# Patient Record
Sex: Male | Born: 1960
Health system: Southern US, Community
[De-identification: ages and names within clinical notes are randomized; demographics above are authoritative.]

## PROBLEM LIST (undated history)

## (undated) DIAGNOSIS — I10 Essential (primary) hypertension: Secondary | ICD-10-CM

## (undated) DIAGNOSIS — B019 Varicella without complication: Secondary | ICD-10-CM

## (undated) DIAGNOSIS — S4440XA Injury of musculocutaneous nerve, unspecified arm, initial encounter: Secondary | ICD-10-CM

## (undated) DIAGNOSIS — M1990 Unspecified osteoarthritis, unspecified site: Secondary | ICD-10-CM

## (undated) DIAGNOSIS — E785 Hyperlipidemia, unspecified: Secondary | ICD-10-CM

## (undated) DIAGNOSIS — M5020 Other cervical disc displacement, unspecified cervical region: Secondary | ICD-10-CM

## (undated) DIAGNOSIS — G959 Disease of spinal cord, unspecified: Secondary | ICD-10-CM

## (undated) DIAGNOSIS — M5030 Other cervical disc degeneration, unspecified cervical region: Secondary | ICD-10-CM

## (undated) DIAGNOSIS — G950 Syringomyelia and syringobulbia: Secondary | ICD-10-CM

## (undated) HISTORY — DX: Unspecified osteoarthritis, unspecified site: M19.90

## (undated) HISTORY — DX: Disease of spinal cord, unspecified: G95.9

## (undated) HISTORY — DX: Injury of musculocutaneous nerve, unspecified arm, initial encounter: S44.40XA

## (undated) HISTORY — PX: OTHER SURGICAL HISTORY: SHX169

## (undated) HISTORY — DX: Syringomyelia and syringobulbia: G95.0

## (undated) HISTORY — PX: EYE SURGERY: SHX253

## (undated) HISTORY — DX: Varicella without complication: B01.9

## (undated) HISTORY — PX: KNEE SURGERY: SHX244

## (undated) HISTORY — PX: ARTHROSCOPIC REPAIR ACL: SUR80

## (undated) HISTORY — PX: POLYPECTOMY: SHX149

## (undated) HISTORY — PX: SHOULDER ARTHROSCOPY W/ LABRAL REPAIR: SHX2399

---

## 2004-11-19 ENCOUNTER — Ambulatory Visit: Payer: Self-pay | Admitting: Family Medicine

## 2005-01-06 ENCOUNTER — Ambulatory Visit: Payer: Self-pay | Admitting: Family Medicine

## 2005-01-13 ENCOUNTER — Ambulatory Visit: Payer: Self-pay | Admitting: Family Medicine

## 2005-09-01 ENCOUNTER — Ambulatory Visit: Payer: Self-pay | Admitting: Family Medicine

## 2006-07-24 ENCOUNTER — Ambulatory Visit: Payer: Self-pay | Admitting: Family Medicine

## 2006-07-31 ENCOUNTER — Ambulatory Visit: Payer: Self-pay | Admitting: Family Medicine

## 2006-08-08 ENCOUNTER — Ambulatory Visit: Payer: Self-pay

## 2006-08-30 ENCOUNTER — Ambulatory Visit: Payer: Self-pay | Admitting: Family Medicine

## 2006-11-13 ENCOUNTER — Ambulatory Visit (HOSPITAL_BASED_OUTPATIENT_CLINIC_OR_DEPARTMENT_OTHER): Admission: RE | Admit: 2006-11-13 | Discharge: 2006-11-13 | Payer: Self-pay | Admitting: Orthopaedic Surgery

## 2009-01-22 ENCOUNTER — Ambulatory Visit: Payer: Self-pay | Admitting: Family Medicine

## 2009-01-22 DIAGNOSIS — Z8679 Personal history of other diseases of the circulatory system: Secondary | ICD-10-CM | POA: Insufficient documentation

## 2009-01-22 DIAGNOSIS — B370 Candidal stomatitis: Secondary | ICD-10-CM | POA: Insufficient documentation

## 2009-04-06 ENCOUNTER — Ambulatory Visit: Payer: Self-pay | Admitting: Family Medicine

## 2009-04-06 LAB — CONVERTED CEMR LAB
Bilirubin Urine: NEGATIVE
Blood in Urine, dipstick: NEGATIVE
Glucose, Urine, Semiquant: NEGATIVE
Ketones, urine, test strip: NEGATIVE
Nitrite: NEGATIVE
Specific Gravity, Urine: 1.015
Urobilinogen, UA: 0.2
WBC Urine, dipstick: NEGATIVE
pH: 7

## 2009-04-10 LAB — CONVERTED CEMR LAB
ALT: 23 units/L (ref 0–53)
AST: 25 units/L (ref 0–37)
Albumin: 4.4 g/dL (ref 3.5–5.2)
Alkaline Phosphatase: 51 units/L (ref 39–117)
BUN: 15 mg/dL (ref 6–23)
Basophils Absolute: 0 10*3/uL (ref 0.0–0.1)
Basophils Relative: 0.2 % (ref 0.0–3.0)
Bilirubin, Direct: 0.1 mg/dL (ref 0.0–0.3)
CO2: 28 meq/L (ref 19–32)
Calcium: 9.2 mg/dL (ref 8.4–10.5)
Chloride: 105 meq/L (ref 96–112)
Cholesterol: 192 mg/dL (ref 0–200)
Creatinine, Ser: 0.8 mg/dL (ref 0.4–1.5)
Eosinophils Absolute: 0.2 10*3/uL (ref 0.0–0.7)
Eosinophils Relative: 4.1 % (ref 0.0–5.0)
GFR calc non Af Amer: 110.88 mL/min (ref >60)
Glucose, Bld: 94 mg/dL (ref 70–99)
HCT: 42.6 % (ref 39.0–52.0)
HDL: 35.8 mg/dL — ABNORMAL LOW (ref >39.00)
Hemoglobin: 14.9 g/dL (ref 13.0–17.0)
LDL Cholesterol: 126 mg/dL — ABNORMAL HIGH (ref 0–99)
Lymphocytes Relative: 36.9 % (ref 12.0–46.0)
Lymphs Abs: 1.4 10*3/uL (ref 0.7–4.0)
MCHC: 34.9 g/dL (ref 30.0–36.0)
MCV: 97.1 fL (ref 78.0–100.0)
Monocytes Absolute: 0.4 10*3/uL (ref 0.1–1.0)
Monocytes Relative: 10.6 % (ref 3.0–12.0)
Neutro Abs: 1.9 10*3/uL (ref 1.4–7.7)
Neutrophils Relative %: 48.2 % (ref 43.0–77.0)
Platelets: 166 10*3/uL (ref 150.0–400.0)
Potassium: 5 meq/L (ref 3.5–5.1)
RBC: 4.38 M/uL (ref 4.22–5.81)
RDW: 11.8 % (ref 11.5–14.6)
Sodium: 142 meq/L (ref 135–145)
TSH: 0.63 microintl units/mL (ref 0.35–5.50)
Total Bilirubin: 1 mg/dL (ref 0.3–1.2)
Total CHOL/HDL Ratio: 5
Total Protein: 6.8 g/dL (ref 6.0–8.3)
Triglycerides: 153 mg/dL — ABNORMAL HIGH (ref 0.0–149.0)
VLDL: 30.6 mg/dL (ref 0.0–40.0)
WBC: 3.9 10*3/uL — ABNORMAL LOW (ref 4.5–10.5)

## 2009-04-21 ENCOUNTER — Ambulatory Visit: Payer: Self-pay | Admitting: Family Medicine

## 2010-06-29 ENCOUNTER — Ambulatory Visit: Payer: Self-pay | Admitting: Family Medicine

## 2010-06-29 DIAGNOSIS — IMO0002 Reserved for concepts with insufficient information to code with codable children: Secondary | ICD-10-CM | POA: Insufficient documentation

## 2010-08-10 ENCOUNTER — Ambulatory Visit: Payer: Self-pay | Admitting: Family Medicine

## 2010-08-10 DIAGNOSIS — K644 Residual hemorrhoidal skin tags: Secondary | ICD-10-CM | POA: Insufficient documentation

## 2010-12-14 NOTE — Assessment & Plan Note (Signed)
Summary: cpx/jls [facility] WITH PT/MHF   Vital Signs:  Patient profile:   50 year old male Height:      70.5 inches Weight:      193 pounds BMI:     27.40 Temp:     98.2 degrees F oral Pulse rate:   56 / minute BP sitting:   114 / 86  (left arm) Cuff size:   regular  Vitals Entered By: Alfred Levins, CMA (April 21, 2009 1:38 PM) CC: cpx   History of Present Illness: 50 yr old male for cpx. Feels fine, and has no concerns.  Allergies (verified): No Known Drug Allergies  Past History:  Past Medical History: Reviewed history from 01/22/2009 and no changes required. Chickenpox normal cardiac stress test 08-08-06  Past Surgical History: Reviewed history from 01/22/2009 and no changes required. Lt eye repair Bunions and bone spurs removed from rt foot Anterior cruciate ligament reconstruction rt knee  Family History: Reviewed history from 01/22/2009 and no changes required. Family History of Alcoholism/Addiction  Social History: Reviewed history from 01/22/2009 and no changes required. Married Never Smoked Alcohol use-yes Drug use-no  Review of Systems  The patient denies anorexia, fever, weight loss, weight gain, vision loss, decreased hearing, hoarseness, chest pain, syncope, dyspnea on exertion, peripheral edema, prolonged cough, headaches, hemoptysis, abdominal pain, melena, hematochezia, severe indigestion/heartburn, hematuria, incontinence, genital sores, muscle weakness, suspicious skin lesions, transient blindness, difficulty walking, depression, unusual weight change, abnormal bleeding, enlarged lymph nodes, angioedema, breast masses, and testicular masses.    Physical Exam  General:  Well-developed,well-nourished,in no acute distress; alert,appropriate and cooperative throughout examination Head:  Normocephalic and atraumatic without obvious abnormalities. No apparent alopecia or balding.  Eyes:  No corneal or conjunctival inflammation noted. EOMI. Perrla. Funduscopic exam benign, without hemorrhages, exudates or papilledema. Vision grossly normal. Ears:  External ear exam shows no significant lesions or deformities.  Otoscopic examination reveals clear canals, tympanic membranes are intact bilaterally without bulging, retraction, inflammation or discharge. Hearing is grossly normal bilaterally. Nose:  External nasal examination shows no deformity or inflammation. Nasal mucosa are pink and moist without lesions or exudates. Mouth:  Oral mucosa and oropharynx without lesions or exudates.  Teeth in good repair. Neck:  No deformities, masses, or tenderness noted. Chest Wall:  No deformities, masses, tenderness or gynecomastia noted. Lungs:  Normal respiratory effort, chest expands symmetrically. Lungs are clear to auscultation, no crackles or wheezes. Heart:  Normal rate and regular rhythm. S1 and S2 normal without gallop, murmur, click, rub or other extra sounds. Abdomen:  Bowel sounds positive,abdomen soft and non-tender without masses, organomegaly or hernias noted. Genitalia:  Testes bilaterally descended without nodularity, tenderness or masses. No scrotal masses or lesions. No penis lesions or urethral discharge. Msk:  No deformity or scoliosis noted of thoracic or lumbar spine.   Pulses:  R and L carotid,radial,femoral,dorsalis pedis and posterior tibial pulses are full and equal bilaterally Extremities:  No clubbing, cyanosis, edema, or deformity noted with normal full range of motion of all joints.   Neurologic:  No cranial nerve deficits noted. Station and gait are normal. Plantar reflexes are down-going bilaterally. DTRs are symmetrical throughout. Sensory, motor and coordinative functions appear intact. Skin:  Intact without suspicious lesions or rashes Cervical Nodes:  No lymphadenopathy noted Axillary Nodes:  No palpable lymphadenopathy  Inguinal Nodes:  No significant adenopathy Psych:  Cognition and judgment appear intact. Alert and cooperative with normal attention span and concentration. No apparent delusions, illusions, hallucinations   Impression & Recommendations:  Problem # 1:  PHYSICAL EXAMINATION (ICD-V70.0)  Complete Medication List: 1)  Fish Oil Oil (Fish oil) .Marland Kitchen.. 1 by mouth once daily 2)  Multivitamins Tabs (Multiple vitamin) .Marland Kitchen.. 1 by mouth once daily  Patient Instructions: 1)  Please schedule a follow-up appointment as needed . Watch the diet.

## 2010-12-14 NOTE — Assessment & Plan Note (Signed)
Summary: pain in groin area ok per deb/njr   Vital Signs:  Patient profile:   50 year old male Weight:      192 pounds Temp:     98.0 degrees F oral BP sitting:   126 / 84  (left arm) Cuff size:   regular  Vitals Entered By: Raechel Ache, RN (June 29, 2010 2:23 PM) CC: C/o dull  groin pain near pelvic bone on left x 4 days.   History of Present Illness: Here for 4 days of a mild achy pain in the left groin. No lumps or swelling. No testicular pain. No urinary problems. No trauma, but he has been working out hard lately with lifting weights and running. He is training to run a marathon.   Allergies: No Known Drug Allergies  Past History:  Past Medical History: Reviewed history from 01/22/2009 and no changes required. Chickenpox normal cardiac stress test 08-08-06  Past Surgical History: Reviewed history from 01/22/2009 and no changes required. Lt eye repair Bunions and bone spurs removed from rt foot Anterior cruciate ligament reconstruction rt knee  Review of Systems  The patient denies anorexia, fever, weight loss, weight gain, vision loss, decreased hearing, hoarseness, chest pain, syncope, dyspnea on exertion, peripheral edema, prolonged cough, headaches, hemoptysis, abdominal pain, melena, hematochezia, severe indigestion/heartburn, hematuria, incontinence, genital sores, muscle weakness, suspicious skin lesions, transient blindness, difficulty walking, depression, unusual weight change, abnormal bleeding, enlarged lymph nodes, angioedema, breast masses, and testicular masses.    Physical Exam  General:  Well-developed,well-nourished,in no acute distress; alert,appropriate and cooperative throughout examination Abdomen:  no inguinal hernia.   Genitalia:  Testes bilaterally descended without nodularity, tenderness or masses. No scrotal masses or lesions. No penis lesions or urethral discharge. Msk:  tender over the insertion of the left hip adductors into the pelvis .  Full ROM of the hip   Impression & Recommendations:  Problem # 1:  GROIN STRAIN (ICD-848.8)  Complete Medication List: 1)  Fish Oil Oil (Fish oil) .Marland Kitchen.. 1 by mouth once daily 2)  Multivitamins Tabs (Multiple vitamin) .Marland Kitchen.. 1 by mouth once daily  Patient Instructions: 1)  rest, heat, Motrin as needed .  2)  Please schedule a follow-up appointment as needed .

## 2010-12-14 NOTE — Assessment & Plan Note (Signed)
Summary: HEMATOCHEZIA? // RS   Vital Signs:  Patient profile:   50 year old male Weight:      191 pounds O2 Sat:      96 % Temp:     09.2 degrees F oral Pulse rate:   57 / minute Resp:     12 per minute BP sitting:   132 / 82  Vitals Entered By: Lynann Beaver CMA (August 10, 2010 2:23 PM) CC: hematochezia x 6 episodes Is Patient Diabetic? No Pain Assessment Patient in pain? no        History of Present Illness: For the past 6 days he has had small amounts of bright red blood with stools. No pain, no difficulty passing stools. No abdominal pain or nausea or fever. He just returned from a business trip to [place].   Current Medications (verified): 1)  Fish Oil   Oil (Fish Oil) .Marland Kitchen.. 1 By Mouth Once Daily 2)  Multivitamins   Tabs (Multiple Vitamin) .Marland Kitchen.. 1 By Mouth Once Daily  Allergies (verified): No Known Drug Allergies  Past History:  Past Medical History: Reviewed history from 01/22/2009 and no changes required. Chickenpox normal cardiac stress test 08-08-06  Past Surgical History: Reviewed history from 01/22/2009 and no changes required. Lt eye repair Bunions and bone spurs removed from rt foot Anterior cruciate ligament reconstruction rt knee  Review of Systems  The patient denies anorexia, fever, weight loss, weight gain, vision loss, decreased hearing, hoarseness, chest pain, syncope, dyspnea on exertion, peripheral edema, prolonged cough, headaches, hemoptysis, abdominal pain, melena, severe indigestion/heartburn, hematuria, incontinence, genital sores, muscle weakness, suspicious skin lesions, transient blindness, difficulty walking, depression, unusual weight change, abnormal bleeding, enlarged lymph nodes, angioedema, breast masses, and testicular masses.    Physical Exam  General:  Well-developed,well-nourished,in no acute distress; alert,appropriate and cooperative throughout examination Abdomen:  Bowel sounds positive,abdomen soft and non-tender without  masses, organomegaly or hernias noted. Rectal:  several small external hemorrhoids visible, one has a partially healed laceration, no visible bleeding   Impression & Recommendations:  Problem # 1:  HEMORRHOIDS, EXTERNAL (ICD-455.3)  Complete Medication List: 1)  Fish Oil Oil (Fish oil) .Marland Kitchen.. 1 by mouth once daily 2)  Multivitamins Tabs (Multiple vitamin) .Marland Kitchen.. 1 by mouth once daily  Patient Instructions: 1)  use Preparation H as needed, increase fiber in the diet, etc.
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
## 2012-03-20 ENCOUNTER — Other Ambulatory Visit (INDEPENDENT_AMBULATORY_CARE_PROVIDER_SITE_OTHER): Payer: Self-pay

## 2012-03-20 DIAGNOSIS — Z0000 Encounter for general adult medical examination without abnormal findings: Secondary | ICD-10-CM

## 2012-03-20 LAB — BASIC METABOLIC PANEL
BUN: 12 mg/dL (ref 6–23)
CO2: 28 mEq/L (ref 19–32)
Calcium: 9.2 mg/dL (ref 8.4–10.5)
Chloride: 102 mEq/L (ref 96–112)
Creatinine, Ser: 0.9 mg/dL (ref 0.4–1.5)
GFR: 93.16 mL/min (ref >60.00)
Glucose, Bld: 91 mg/dL (ref 70–99)
Potassium: 4.9 mEq/L (ref 3.5–5.1)
Sodium: 141 mEq/L (ref 135–145)

## 2012-03-20 LAB — POCT URINALYSIS DIPSTICK
Bilirubin, UA: NEGATIVE
Blood, UA: NEGATIVE
Glucose, UA: NEGATIVE
Ketones, UA: NEGATIVE
Leukocytes, UA: NEGATIVE
Nitrite, UA: NEGATIVE
Spec Grav, UA: 1.02
Urobilinogen, UA: 0.2
pH, UA: 7.5

## 2012-03-20 LAB — CBC WITH DIFFERENTIAL/PLATELET
Basophils Absolute: 0 10*3/uL (ref 0.0–0.1)
Basophils Relative: 0.6 % (ref 0.0–3.0)
Eosinophils Absolute: 0.1 10*3/uL (ref 0.0–0.7)
Eosinophils Relative: 1.7 % (ref 0.0–5.0)
HCT: 47.8 % (ref 39.0–52.0)
Hemoglobin: 16.4 g/dL (ref 13.0–17.0)
Lymphocytes Relative: 20 % (ref 12.0–46.0)
Lymphs Abs: 0.9 10*3/uL (ref 0.7–4.0)
MCHC: 34.3 g/dL (ref 30.0–36.0)
MCV: 100.2 fl — ABNORMAL HIGH (ref 78.0–100.0)
Monocytes Absolute: 0.4 10*3/uL (ref 0.1–1.0)
Monocytes Relative: 9.4 % (ref 3.0–12.0)
Neutro Abs: 3.2 10*3/uL (ref 1.4–7.7)
Neutrophils Relative %: 68.3 % (ref 43.0–77.0)
Platelets: 185 10*3/uL (ref 150.0–400.0)
RBC: 4.77 Mil/uL (ref 4.22–5.81)
RDW: 13.4 % (ref 11.5–14.6)
WBC: 4.7 10*3/uL (ref 4.5–10.5)

## 2012-03-20 LAB — LDL CHOLESTEROL, DIRECT: Direct LDL: 147.3 mg/dL

## 2012-03-20 LAB — HEPATIC FUNCTION PANEL
ALT: 25 U/L (ref 0–53)
AST: 28 U/L (ref 0–37)
Albumin: 4.3 g/dL (ref 3.5–5.2)
Alkaline Phosphatase: 43 U/L (ref 39–117)
Bilirubin, Direct: 0.1 mg/dL (ref 0.0–0.3)
Total Bilirubin: 0.9 mg/dL (ref 0.3–1.2)
Total Protein: 7.3 g/dL (ref 6.0–8.3)

## 2012-03-20 LAB — LIPID PANEL
Cholesterol: 210 mg/dL — ABNORMAL HIGH (ref 0–200)
HDL: 47.3 mg/dL (ref >39.00)
Total CHOL/HDL Ratio: 4
Triglycerides: 154 mg/dL — ABNORMAL HIGH (ref 0.0–149.0)
VLDL: 30.8 mg/dL (ref 0.0–40.0)

## 2012-03-20 LAB — TSH: TSH: 0.44 u[IU]/mL (ref 0.35–5.50)

## 2012-03-23 ENCOUNTER — Encounter: Payer: Self-pay | Admitting: Family Medicine

## 2012-03-23 NOTE — Progress Notes (Signed)
Quick Note:  I spoke with pt and put a copy of results in mail. ______ 

## 2012-03-26 ENCOUNTER — Encounter: Payer: Self-pay | Admitting: Family Medicine

## 2012-03-27 ENCOUNTER — Ambulatory Visit (INDEPENDENT_AMBULATORY_CARE_PROVIDER_SITE_OTHER): Payer: Self-pay | Admitting: Family Medicine

## 2012-03-27 ENCOUNTER — Encounter: Payer: Self-pay | Admitting: Family Medicine

## 2012-03-27 VITALS — BP 160/100 | HR 71 | Temp 98.4°F | Ht 70.0 in | Wt 189.0 lb

## 2012-03-27 DIAGNOSIS — Z0000 Encounter for general adult medical examination without abnormal findings: Secondary | ICD-10-CM

## 2012-03-27 DIAGNOSIS — I10 Essential (primary) hypertension: Secondary | ICD-10-CM

## 2012-03-27 MED ORDER — LISINOPRIL 10 MG PO TABS: 10.0000 mg | ORAL_TABLET | Freq: Every day | ORAL | Status: DC

## 2012-03-27 NOTE — Progress Notes (Signed)
  Subjective:    Patient ID: Todd Reid, male    DOB: 1961/05/13, 51 y.o.   MRN: [id number]  HPI 51 yr old male for a cpx. He feels well with no complaints. He has been dealing with a grief reaction since his wife, Thurston Hole, died of metastatic breast cancer last November. He has been talking to his friends and the clergy at his church. He has done fairly well with this process, and he has even been on a few dates. His BP has been consistently high for the past few months. He tries to eat a healthy diet but had not been exercising until recently.    Review of Systems  Constitutional: Negative.   HENT: Negative.   Eyes: Negative.   Respiratory: Negative.   Cardiovascular: Negative.   Gastrointestinal: Negative.   Genitourinary: Negative.   Musculoskeletal: Negative.   Skin: Negative.   Neurological: Negative.   Hematological: Negative.   Psychiatric/Behavioral: Negative.        Objective:   Physical Exam  Constitutional: He is oriented to person, place, and time. He appears well-developed and well-nourished. No distress.  HENT:  Head: Normocephalic and atraumatic.  Right Ear: External ear normal.  Left Ear: External ear normal.  Nose: Nose normal.  Mouth/Throat: Oropharynx is clear and moist. No oropharyngeal exudate.  Eyes: Conjunctivae and EOM are normal. Pupils are equal, round, and reactive to light. Right eye exhibits no discharge. Left eye exhibits no discharge. No scleral icterus.  Neck: Neck supple. No JVD present. No tracheal deviation present. No thyromegaly present.  Cardiovascular: Normal rate, regular rhythm, normal heart sounds and intact distal pulses.  Exam reveals no gallop and no friction rub.   No murmur heard.      EKG normal   Pulmonary/Chest: Effort normal and breath sounds normal. No respiratory distress. He has no wheezes. He has no rales. He exhibits no tenderness.  Abdominal: Soft. Bowel sounds are normal. He exhibits no distension and no mass. There is  no tenderness. There is no rebound and no guarding.  Genitourinary: Rectum normal, prostate normal and penis normal. Guaiac negative stool. No penile tenderness.  Musculoskeletal: Normal range of motion. He exhibits no edema and no tenderness.  Lymphadenopathy:    He has no cervical adenopathy.  Neurological: He is alert and oriented to person, place, and time. He has normal reflexes. No cranial nerve deficit. He exhibits normal muscle tone. Coordination normal.  Skin: Skin is warm and dry. No rash noted. He is not diaphoretic. No erythema. No pallor.  Psychiatric: He has a normal mood and affect. His behavior is normal. Judgment and thought content normal.          Assessment & Plan:  Well exam. We will start him on Lisinopril daily and he will recheck in one month

## 2012-05-04 ENCOUNTER — Ambulatory Visit (INDEPENDENT_AMBULATORY_CARE_PROVIDER_SITE_OTHER): Payer: Self-pay | Admitting: Family Medicine

## 2012-05-04 ENCOUNTER — Encounter: Payer: Self-pay | Admitting: Family Medicine

## 2012-05-04 VITALS — BP 140/80 | HR 63 | Temp 98.3°F | Wt 186.0 lb

## 2012-05-04 DIAGNOSIS — Z0000 Encounter for general adult medical examination without abnormal findings: Secondary | ICD-10-CM

## 2012-05-04 DIAGNOSIS — I10 Essential (primary) hypertension: Secondary | ICD-10-CM

## 2012-05-04 MED ORDER — LISINOPRIL-HYDROCHLOROTHIAZIDE 10-12.5 MG PO TABS: 1.0000 | ORAL_TABLET | Freq: Every day | ORAL | Status: DC

## 2012-05-04 NOTE — Progress Notes (Signed)
  Subjective:    Patient ID: Todd Reid, male    DOB: [birth year], 51 y.o.   MRN: [id number]  HPI 51 yr old male for a cpx. He feels great but has one question. He has had some erection difficulties lately and wants to try something for this. He achieves erections easily but they don't last very long. He is working out with a Systems analyst.    Review of Systems  Constitutional: Negative.   HENT: Negative.   Eyes: Negative.   Respiratory: Negative.   Cardiovascular: Negative.   Gastrointestinal: Negative.   Genitourinary: Negative.   Musculoskeletal: Negative.   Skin: Negative.   Neurological: Negative.   Hematological: Negative.   Psychiatric/Behavioral: Negative.        Objective:   Physical Exam  Constitutional: He is oriented to person, place, and time. He appears well-developed and well-nourished. No distress.  HENT:  Head: Normocephalic and atraumatic.  Right Ear: External ear normal.  Left Ear: External ear normal.  Nose: Nose normal.  Mouth/Throat: Oropharynx is clear and moist. No oropharyngeal exudate.  Eyes: Conjunctivae and EOM are normal. Pupils are equal, round, and reactive to light. Right eye exhibits no discharge. Left eye exhibits no discharge. No scleral icterus.  Neck: Neck supple. No JVD present. No tracheal deviation present. No thyromegaly present.  Cardiovascular: Normal rate, regular rhythm and intact distal pulses.  Exam reveals no gallop and no friction rub.        2/6 SM over the left sternal border   Pulmonary/Chest: Effort normal and breath sounds normal. No respiratory distress. He has no wheezes. He has no rales. He exhibits no tenderness.  Abdominal: Soft. Bowel sounds are normal. He exhibits no distension and no mass. There is no tenderness. There is no rebound and no guarding.  Genitourinary: Rectum normal, prostate normal and penis normal. Guaiac negative stool. No penile tenderness.  Musculoskeletal: Normal range of motion. He exhibits  no edema and no tenderness.  Lymphadenopathy:    He has no cervical adenopathy.  Neurological: He is alert and oriented to person, place, and time. He has normal reflexes. No cranial nerve deficit. He exhibits normal muscle tone. Coordination normal.  Skin: Skin is warm and dry. No rash noted. He is not diaphoretic. No erythema. No pallor.  Psychiatric: He has a normal mood and affect. His behavior is normal. Judgment and thought content normal.          Assessment & Plan:  Well exam. We will set up an ECHO to evaluate the murmur. The last one he had was over 10 years ago. Given samples to try Cialis 20mg . We will change his BP med to Lisinopril HCT and recheck in one month

## 2012-05-25 ENCOUNTER — Telehealth: Payer: Self-pay | Admitting: Family Medicine

## 2012-05-25 NOTE — Telephone Encounter (Signed)
Call in Cialis 20 mg prn, #10 with 11 rf 

## 2012-05-25 NOTE — Telephone Encounter (Signed)
Pt states he was offered a rx for cialis previously but declined.  However now he would like to have rx sent in to [facility] on [address].

## 2012-05-28 MED ORDER — TADALAFIL 20 MG PO TABS: 20.0000 mg | ORAL_TABLET | Freq: Every day | ORAL | Status: DC | PRN

## 2012-05-28 NOTE — Telephone Encounter (Signed)
I sent script e-scribe and spoke with pt. 

## 2012-09-26 ENCOUNTER — Ambulatory Visit (INDEPENDENT_AMBULATORY_CARE_PROVIDER_SITE_OTHER): Payer: Self-pay | Admitting: Family Medicine

## 2012-09-26 ENCOUNTER — Encounter: Payer: Self-pay | Admitting: Family Medicine

## 2012-09-26 VITALS — BP 124/80 | HR 56 | Temp 98.2°F | Wt 189.0 lb

## 2012-09-26 DIAGNOSIS — H532 Diplopia: Secondary | ICD-10-CM

## 2012-09-26 DIAGNOSIS — R42 Dizziness and giddiness: Secondary | ICD-10-CM

## 2012-09-26 DIAGNOSIS — M542 Cervicalgia: Secondary | ICD-10-CM

## 2012-09-26 DIAGNOSIS — Z23 Encounter for immunization: Secondary | ICD-10-CM
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
## 2012-11-09 ENCOUNTER — Ambulatory Visit (INDEPENDENT_AMBULATORY_CARE_PROVIDER_SITE_OTHER): Payer: BC Managed Care – PPO | Admitting: Family Medicine

## 2012-11-09 ENCOUNTER — Encounter: Payer: Self-pay | Admitting: Family Medicine

## 2012-11-09 VITALS — BP 110/80 | HR 60 | Temp 98.1°F | Wt 186.0 lb

## 2012-11-09 DIAGNOSIS — J329 Chronic sinusitis, unspecified: Secondary | ICD-10-CM

## 2012-11-09 DIAGNOSIS — J029 Acute pharyngitis, unspecified: Secondary | ICD-10-CM

## 2012-11-09 LAB — POCT RAPID STREP A (OFFICE): Rapid Strep A Screen: NEGATIVE

## 2012-11-09 MED ORDER — AMOXICILLIN-POT CLAVULANATE 875-125 MG PO TABS: 1.0000 | ORAL_TABLET | Freq: Two times a day (BID) | ORAL | Status: DC

## 2012-11-09 NOTE — Progress Notes (Signed)
  Subjective:    Patient ID: Todd Reid, male    DOB: [birth year], 51 y.o.   MRN: [id number]  HPI Here for 3 days of sinus pressure, PND, ST, and a dry cough. No fever.    Review of Systems  Constitutional: Negative.   HENT: Positive for congestion, postnasal drip and sinus pressure.   Eyes: Negative.   Respiratory: Positive for cough.        Objective:   Physical Exam  Constitutional: He appears well-developed and well-nourished.  HENT:  Right Ear: External ear normal.  Left Ear: External ear normal.  Nose: Nose normal.  Mouth/Throat: Oropharynx is clear and moist.  Eyes: Conjunctivae normal are normal.  Pulmonary/Chest: Effort normal and breath sounds normal.  Lymphadenopathy:    He has no cervical adenopathy.          Assessment & Plan:  Add Mucinex

## 2012-12-21 ENCOUNTER — Ambulatory Visit
Payer: BC Managed Care – PPO | Attending: Diagnostic Neuroimaging | Admitting: Rehabilitative and Restorative Service Providers"

## 2012-12-21 DIAGNOSIS — M542 Cervicalgia: Secondary | ICD-10-CM | POA: Insufficient documentation

## 2012-12-21 DIAGNOSIS — R269 Unspecified abnormalities of gait and mobility: Secondary | ICD-10-CM | POA: Insufficient documentation

## 2012-12-21 DIAGNOSIS — R42 Dizziness and giddiness: Secondary | ICD-10-CM | POA: Insufficient documentation

## 2012-12-21 DIAGNOSIS — IMO0001 Reserved for inherently not codable concepts without codable children: Secondary | ICD-10-CM | POA: Insufficient documentation

## 2012-12-25 ENCOUNTER — Ambulatory Visit: Payer: BC Managed Care – PPO | Admitting: Physical Therapy

## 2012-12-28 ENCOUNTER — Ambulatory Visit: Payer: BC Managed Care – PPO | Admitting: Rehabilitative and Restorative Service Providers"

## 2013-01-08 ENCOUNTER — Ambulatory Visit: Payer: BC Managed Care – PPO | Admitting: Rehabilitative and Restorative Service Providers"

## 2013-01-10 ENCOUNTER — Ambulatory Visit: Payer: BC Managed Care – PPO | Admitting: Rehabilitative and Restorative Service Providers"

## 2013-01-14 ENCOUNTER — Ambulatory Visit
Payer: BC Managed Care – PPO | Attending: Diagnostic Neuroimaging | Admitting: Rehabilitative and Restorative Service Providers"

## 2013-01-14 DIAGNOSIS — M542 Cervicalgia: Secondary | ICD-10-CM | POA: Insufficient documentation

## 2013-01-14 DIAGNOSIS — R42 Dizziness and giddiness: Secondary | ICD-10-CM | POA: Insufficient documentation

## 2013-01-14 DIAGNOSIS — IMO0001 Reserved for inherently not codable concepts without codable children: Secondary | ICD-10-CM | POA: Insufficient documentation

## 2013-01-14 DIAGNOSIS — R269 Unspecified abnormalities of gait and mobility: Secondary | ICD-10-CM | POA: Insufficient documentation

## 2013-01-15 ENCOUNTER — Encounter: Payer: BC Managed Care – PPO | Admitting: Rehabilitative and Restorative Service Providers"

## 2013-01-17 ENCOUNTER — Ambulatory Visit: Payer: BC Managed Care – PPO | Admitting: Rehabilitative and Restorative Service Providers"

## 2013-01-23 ENCOUNTER — Ambulatory Visit: Payer: BC Managed Care – PPO | Admitting: Rehabilitative and Restorative Service Providers"

## 2013-01-28 ENCOUNTER — Ambulatory Visit: Payer: BC Managed Care – PPO | Admitting: Rehabilitative and Restorative Service Providers"

## 2013-01-31 ENCOUNTER — Ambulatory Visit: Payer: BC Managed Care – PPO | Admitting: Rehabilitative and Restorative Service Providers"

## 2013-02-07 ENCOUNTER — Ambulatory Visit: Payer: BC Managed Care – PPO | Admitting: Rehabilitative and Restorative Service Providers"

## 2013-02-13 ENCOUNTER — Ambulatory Visit
Payer: BC Managed Care – PPO | Attending: Diagnostic Neuroimaging | Admitting: Rehabilitative and Restorative Service Providers"

## 2013-02-13 DIAGNOSIS — M542 Cervicalgia: Secondary | ICD-10-CM | POA: Insufficient documentation

## 2013-02-13 DIAGNOSIS — R42 Dizziness and giddiness: Secondary | ICD-10-CM | POA: Insufficient documentation

## 2013-02-13 DIAGNOSIS — R269 Unspecified abnormalities of gait and mobility: Secondary | ICD-10-CM | POA: Insufficient documentation

## 2013-02-13 DIAGNOSIS — IMO0001 Reserved for inherently not codable concepts without codable children: Secondary | ICD-10-CM | POA: Insufficient documentation

## 2013-05-10 ENCOUNTER — Ambulatory Visit (INDEPENDENT_AMBULATORY_CARE_PROVIDER_SITE_OTHER): Payer: BC Managed Care – PPO | Admitting: Family Medicine

## 2013-05-10 ENCOUNTER — Encounter: Payer: Self-pay | Admitting: Family Medicine

## 2013-05-10 VITALS — BP 130/84 | HR 63 | Temp 98.1°F | Wt 195.0 lb

## 2013-05-10 DIAGNOSIS — J069 Acute upper respiratory infection, unspecified: Secondary | ICD-10-CM

## 2013-05-10 NOTE — Progress Notes (Signed)
  Subjective:    Patient ID: Todd Reid, male    DOB: 05-12-1961, 52 y.o.   MRN: [id number]  HPI Here with 2 days of a ST. No fever or HA or cough.    Review of Systems  Constitutional: Negative.   HENT: Positive for sore throat. Negative for ear pain, congestion, postnasal drip and sinus pressure.   Eyes: Negative.   Respiratory: Negative.        Objective:   Physical Exam  Constitutional: He appears well-developed and well-nourished.  HENT:  Right Ear: External ear normal.  Left Ear: External ear normal.  Nose: Nose normal.  His uvula is red and swollen   Eyes: Conjunctivae are normal.  Neck: No thyromegaly present.  Pulmonary/Chest: Effort normal and breath sounds normal.  Lymphadenopathy:    He has no cervical adenopathy.          Assessment & Plan:  Rest, drink fluids, use Motrin prn

## 2013-06-17 ENCOUNTER — Other Ambulatory Visit: Payer: Self-pay | Admitting: Family Medicine

## 2013-08-30 ENCOUNTER — Other Ambulatory Visit: Payer: Self-pay | Admitting: Family Medicine

## 2013-10-13 ENCOUNTER — Other Ambulatory Visit: Payer: Self-pay | Admitting: Family Medicine

## 2013-10-14 NOTE — Telephone Encounter (Signed)
Looks like pt needs office visit, can we refill this?

## 2013-10-14 NOTE — Telephone Encounter (Signed)
Refill for one year 

## 2014-10-15 ENCOUNTER — Other Ambulatory Visit: Payer: Self-pay | Admitting: Family Medicine

## 2014-12-26 ENCOUNTER — Other Ambulatory Visit: Payer: Self-pay | Admitting: Family Medicine

## 2015-01-20 ENCOUNTER — Other Ambulatory Visit (INDEPENDENT_AMBULATORY_CARE_PROVIDER_SITE_OTHER): Payer: BLUE CROSS/BLUE SHIELD

## 2015-01-20 DIAGNOSIS — Z0000 Encounter for general adult medical examination without abnormal findings: Secondary | ICD-10-CM

## 2015-01-20 LAB — POCT URINALYSIS DIPSTICK
Blood, UA: NEGATIVE
Glucose, UA: NEGATIVE
Ketones, UA: NEGATIVE
Leukocytes, UA: NEGATIVE
Nitrite, UA: NEGATIVE
Spec Grav, UA: 1.02
Urobilinogen, UA: 0.2
pH, UA: 5.5

## 2015-01-20 LAB — CBC WITH DIFFERENTIAL/PLATELET
Basophils Absolute: 0 10*3/uL (ref 0.0–0.1)
Basophils Relative: 0.7 % (ref 0.0–3.0)
Eosinophils Absolute: 0.1 10*3/uL (ref 0.0–0.7)
Eosinophils Relative: 3.8 % (ref 0.0–5.0)
HCT: 44.9 % (ref 39.0–52.0)
Hemoglobin: 15.9 g/dL (ref 13.0–17.0)
Lymphocytes Relative: 35.1 % (ref 12.0–46.0)
Lymphs Abs: 1.3 10*3/uL (ref 0.7–4.0)
MCHC: 35.4 g/dL (ref 30.0–36.0)
MCV: 93.6 fl (ref 78.0–100.0)
Monocytes Absolute: 0.4 10*3/uL (ref 0.1–1.0)
Monocytes Relative: 10.8 % (ref 3.0–12.0)
Neutro Abs: 1.8 10*3/uL (ref 1.4–7.7)
Neutrophils Relative %: 49.6 % (ref 43.0–77.0)
Platelets: 203 10*3/uL (ref 150.0–400.0)
RBC: 4.8 Mil/uL (ref 4.22–5.81)
RDW: 12.2 % (ref 11.5–15.5)
WBC: 3.7 10*3/uL — ABNORMAL LOW (ref 4.0–10.5)

## 2015-01-20 LAB — HEPATIC FUNCTION PANEL
ALT: 24 U/L (ref 0–53)
AST: 24 U/L (ref 0–37)
Albumin: 4.9 g/dL (ref 3.5–5.2)
Alkaline Phosphatase: 48 U/L (ref 39–117)
Bilirubin, Direct: 0.2 mg/dL (ref 0.0–0.3)
Total Bilirubin: 0.8 mg/dL (ref 0.2–1.2)
Total Protein: 7.2 g/dL (ref 6.0–8.3)

## 2015-01-20 LAB — BASIC METABOLIC PANEL
BUN: 16 mg/dL (ref 6–23)
CO2: 30 mEq/L (ref 19–32)
Calcium: 9.7 mg/dL (ref 8.4–10.5)
Chloride: 104 mEq/L (ref 96–112)
Creatinine, Ser: 0.98 mg/dL (ref 0.40–1.50)
GFR: 85.62 mL/min (ref >60.00)
Glucose, Bld: 103 mg/dL — ABNORMAL HIGH (ref 70–99)
Potassium: 4.2 mEq/L (ref 3.5–5.1)
Sodium: 139 mEq/L (ref 135–145)

## 2015-01-20 LAB — LIPID PANEL
Cholesterol: 252 mg/dL — ABNORMAL HIGH (ref 0–200)
HDL: 47.5 mg/dL (ref >39.00)
LDL Cholesterol: 183 mg/dL — ABNORMAL HIGH (ref 0–99)
NonHDL: 204.5
Total CHOL/HDL Ratio: 5
Triglycerides: 109 mg/dL (ref 0.0–149.0)
VLDL: 21.8 mg/dL (ref 0.0–40.0)

## 2015-01-20 LAB — PSA: PSA: 0.29 ng/mL (ref 0.10–4.00)

## 2015-01-20 LAB — TSH: TSH: 0.92 u[IU]/mL (ref 0.35–4.50)

## 2015-01-26 ENCOUNTER — Ambulatory Visit (INDEPENDENT_AMBULATORY_CARE_PROVIDER_SITE_OTHER): Payer: BLUE CROSS/BLUE SHIELD | Admitting: Family Medicine

## 2015-01-26 ENCOUNTER — Encounter: Payer: Self-pay | Admitting: Family Medicine

## 2015-01-26 VITALS — BP 134/80 | HR 61 | Temp 98.0°F | Ht 70.0 in | Wt 196.0 lb

## 2015-01-26 DIAGNOSIS — Z0000 Encounter for general adult medical examination without abnormal findings: Secondary | ICD-10-CM

## 2015-01-26 DIAGNOSIS — E785 Hyperlipidemia, unspecified: Secondary | ICD-10-CM

## 2015-01-26 MED ORDER — ATORVASTATIN CALCIUM 20 MG PO TABS: 20.0000 mg | ORAL_TABLET | Freq: Every day | ORAL | Status: DC

## 2015-01-26 MED ORDER — LISINOPRIL-HYDROCHLOROTHIAZIDE 10-12.5 MG PO TABS: 1.0000 | ORAL_TABLET | Freq: Every day | ORAL | Status: DC

## 2015-01-26 NOTE — Progress Notes (Signed)
Pre visit review using our clinic review tool, if applicable. No additional management support is needed unless otherwise documented below in the visit note. 

## 2015-01-26 NOTE — Progress Notes (Signed)
   Subjective:    Patient ID: Todd Reid, male    DOB: 08-03-1961, 54 y.o.   MRN: [id number]  HPI 54 yr old male for a cpx. He feels well.    Review of Systems  Constitutional: Negative.   HENT: Negative.   Eyes: Negative.   Respiratory: Negative.   Cardiovascular: Negative.   Gastrointestinal: Negative.   Genitourinary: Negative.   Musculoskeletal: Negative.   Skin: Negative.   Neurological: Negative.   Psychiatric/Behavioral: Negative.        Objective:   Physical Exam  Constitutional: He is oriented to person, place, and time. He appears well-developed and well-nourished. No distress.  HENT:  Head: Normocephalic and atraumatic.  Right Ear: External ear normal.  Left Ear: External ear normal.  Nose: Nose normal.  Mouth/Throat: Oropharynx is clear and moist. No oropharyngeal exudate.  Eyes: Conjunctivae and EOM are normal. Pupils are equal, round, and reactive to light. Right eye exhibits no discharge. Left eye exhibits no discharge. No scleral icterus.  Neck: Neck supple. No JVD present. No tracheal deviation present. No thyromegaly present.  Cardiovascular: Normal rate, regular rhythm, normal heart sounds and intact distal pulses.  Exam reveals no gallop and no friction rub.   No murmur heard. EKG normal   Pulmonary/Chest: Effort normal and breath sounds normal. No respiratory distress. He has no wheezes. He has no rales. He exhibits no tenderness.  Abdominal: Soft. Bowel sounds are normal. He exhibits no distension and no mass. There is no tenderness. There is no rebound and no guarding.  Genitourinary: Rectum normal, prostate normal and penis normal. Guaiac negative stool. No penile tenderness.  Musculoskeletal: Normal range of motion. He exhibits no edema or tenderness.  Lymphadenopathy:    He has no cervical adenopathy.  Neurological: He is alert and oriented to person, place, and time. He has normal reflexes. No cranial nerve deficit. He exhibits normal muscle  tone. Coordination normal.  Skin: Skin is warm and dry. No rash noted. He is not diaphoretic. No erythema. No pallor.  Psychiatric: He has a normal mood and affect. His behavior is normal. Judgment and thought content normal.          Assessment & Plan:  Well exam. Start on Lipitor 20 mg daily. Recheck labs in 90 days

## 2015-03-19 ENCOUNTER — Other Ambulatory Visit: Payer: Self-pay

## 2015-03-19 MED ORDER — LISINOPRIL-HYDROCHLOROTHIAZIDE 10-12.5 MG PO TABS: 1.0000 | ORAL_TABLET | Freq: Every day | ORAL | Status: DC

## 2015-03-19 MED ORDER — ATORVASTATIN CALCIUM 20 MG PO TABS: 20.0000 mg | ORAL_TABLET | Freq: Every day | ORAL | Status: DC

## 2015-04-30 ENCOUNTER — Encounter: Payer: Self-pay | Admitting: Family Medicine

## 2015-04-30 ENCOUNTER — Ambulatory Visit (INDEPENDENT_AMBULATORY_CARE_PROVIDER_SITE_OTHER): Payer: BLUE CROSS/BLUE SHIELD | Admitting: Family Medicine

## 2015-04-30 VITALS — BP 122/73 | HR 68 | Temp 98.2°F | Ht 70.0 in | Wt 194.0 lb

## 2015-04-30 DIAGNOSIS — E785 Hyperlipidemia, unspecified: Secondary | ICD-10-CM | POA: Diagnosis not present

## 2015-04-30 DIAGNOSIS — N486 Induration penis plastica: Secondary | ICD-10-CM | POA: Insufficient documentation

## 2015-04-30 DIAGNOSIS — M542 Cervicalgia: Secondary | ICD-10-CM

## 2015-04-30 DIAGNOSIS — I10 Essential (primary) hypertension: Secondary | ICD-10-CM

## 2015-04-30 DIAGNOSIS — G8929 Other chronic pain: Secondary | ICD-10-CM | POA: Insufficient documentation
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
## 2015-04-30 NOTE — Progress Notes (Signed)
Pre visit review using our clinic review tool, if applicable. No additional management support is needed unless otherwise documented below in the visit note. 

## 2015-05-11 ENCOUNTER — Other Ambulatory Visit (INDEPENDENT_AMBULATORY_CARE_PROVIDER_SITE_OTHER): Payer: BLUE CROSS/BLUE SHIELD

## 2015-05-11 DIAGNOSIS — Z0000 Encounter for general adult medical examination without abnormal findings: Secondary | ICD-10-CM

## 2015-05-11 DIAGNOSIS — E785 Hyperlipidemia, unspecified: Secondary | ICD-10-CM | POA: Diagnosis not present

## 2015-05-11 LAB — POCT URINALYSIS DIPSTICK
Bilirubin, UA: NEGATIVE
Blood, UA: NEGATIVE
Glucose, UA: NEGATIVE
Ketones, UA: NEGATIVE
Leukocytes, UA: NEGATIVE
Nitrite, UA: NEGATIVE
Protein, UA: NEGATIVE
Spec Grav, UA: 1.015
Urobilinogen, UA: 0.2
pH, UA: 6

## 2015-05-11 LAB — CBC WITH DIFFERENTIAL/PLATELET
Basophils Absolute: 0 10*3/uL (ref 0.0–0.1)
Basophils Relative: 0.7 % (ref 0.0–3.0)
Eosinophils Absolute: 0.2 10*3/uL (ref 0.0–0.7)
Eosinophils Relative: 4.5 % (ref 0.0–5.0)
HCT: 45.5 % (ref 39.0–52.0)
Hemoglobin: 15.8 g/dL (ref 13.0–17.0)
Lymphocytes Relative: 33.5 % (ref 12.0–46.0)
Lymphs Abs: 1.5 10*3/uL (ref 0.7–4.0)
MCHC: 34.7 g/dL (ref 30.0–36.0)
MCV: 95.9 fl (ref 78.0–100.0)
Monocytes Absolute: 0.4 10*3/uL (ref 0.1–1.0)
Monocytes Relative: 9.3 % (ref 3.0–12.0)
Neutro Abs: 2.3 10*3/uL (ref 1.4–7.7)
Neutrophils Relative %: 52 % (ref 43.0–77.0)
Platelets: 208 10*3/uL (ref 150.0–400.0)
RBC: 4.74 Mil/uL (ref 4.22–5.81)
RDW: 12.2 % (ref 11.5–15.5)
WBC: 4.5 10*3/uL (ref 4.0–10.5)

## 2015-05-11 LAB — LIPID PANEL
Cholesterol: 171 mg/dL (ref 0–200)
HDL: 47.8 mg/dL (ref >39.00)
LDL Cholesterol: 95 mg/dL (ref 0–99)
NonHDL: 123.2
Total CHOL/HDL Ratio: 4
Triglycerides: 140 mg/dL (ref 0.0–149.0)
VLDL: 28 mg/dL (ref 0.0–40.0)

## 2015-05-11 LAB — HEPATIC FUNCTION PANEL
ALT: 37 U/L (ref 0–53)
AST: 25 U/L (ref 0–37)
Albumin: 4.6 g/dL (ref 3.5–5.2)
Alkaline Phosphatase: 57 U/L (ref 39–117)
Bilirubin, Direct: 0.2 mg/dL (ref 0.0–0.3)
Total Bilirubin: 0.8 mg/dL (ref 0.2–1.2)
Total Protein: 7 g/dL (ref 6.0–8.3)

## 2015-05-11 LAB — BASIC METABOLIC PANEL
BUN: 12 mg/dL (ref 6–23)
CO2: 32 mEq/L (ref 19–32)
Calcium: 9.8 mg/dL (ref 8.4–10.5)
Chloride: 101 mEq/L (ref 96–112)
Creatinine, Ser: 0.95 mg/dL (ref 0.40–1.50)
GFR: 88.64 mL/min (ref >60.00)
Glucose, Bld: 93 mg/dL (ref 70–99)
Potassium: 4.6 mEq/L (ref 3.5–5.1)
Sodium: 139 mEq/L (ref 135–145)

## 2015-05-11 LAB — TSH: TSH: 0.75 u[IU]/mL (ref 0.35–4.50)

## 2015-05-11 LAB — PSA: PSA: 0.23 ng/mL (ref 0.10–4.00)

## 2015-07-10 ENCOUNTER — Telehealth: Payer: Self-pay | Admitting: Family Medicine

## 2015-07-10 NOTE — Telephone Encounter (Signed)
Pt request refill of the following: CIALIS 20 MG tablet   [facility] on [address]

## 2015-07-10 NOTE — Telephone Encounter (Signed)
Call in #10 with 11 rf 

## 2015-07-13 MED ORDER — TADALAFIL 20 MG PO TABS
20.0000 mg | ORAL_TABLET | Freq: Every day | ORAL | Status: DC | PRN
Start: 2015-07-13 — End: 2016-02-01

## 2015-07-13 NOTE — Telephone Encounter (Signed)
I sent script e-scribe. 

## 2016-01-25 ENCOUNTER — Other Ambulatory Visit (INDEPENDENT_AMBULATORY_CARE_PROVIDER_SITE_OTHER): Payer: BLUE CROSS/BLUE SHIELD

## 2016-01-25 DIAGNOSIS — Z0000 Encounter for general adult medical examination without abnormal findings: Secondary | ICD-10-CM | POA: Diagnosis not present

## 2016-01-25 LAB — BASIC METABOLIC PANEL
BUN: 14 mg/dL (ref 6–23)
CO2: 30 mEq/L (ref 19–32)
Calcium: 9.6 mg/dL (ref 8.4–10.5)
Chloride: 99 mEq/L (ref 96–112)
Creatinine, Ser: 1.03 mg/dL (ref 0.40–1.50)
GFR: 80.52 mL/min (ref >60.00)
Glucose, Bld: 98 mg/dL (ref 70–99)
Potassium: 4.2 mEq/L (ref 3.5–5.1)
Sodium: 137 mEq/L (ref 135–145)

## 2016-01-25 LAB — CBC WITH DIFFERENTIAL/PLATELET
Basophils Absolute: 0 10*3/uL (ref 0.0–0.1)
Basophils Relative: 0.7 % (ref 0.0–3.0)
Eosinophils Absolute: 0.2 10*3/uL (ref 0.0–0.7)
Eosinophils Relative: 4.7 % (ref 0.0–5.0)
HCT: 43 % (ref 39.0–52.0)
Hemoglobin: 15.1 g/dL (ref 13.0–17.0)
Lymphocytes Relative: 31.9 % (ref 12.0–46.0)
Lymphs Abs: 1.4 10*3/uL (ref 0.7–4.0)
MCHC: 35.1 g/dL (ref 30.0–36.0)
MCV: 94.1 fl (ref 78.0–100.0)
Monocytes Absolute: 0.5 10*3/uL (ref 0.1–1.0)
Monocytes Relative: 10.9 % (ref 3.0–12.0)
Neutro Abs: 2.2 10*3/uL (ref 1.4–7.7)
Neutrophils Relative %: 51.8 % (ref 43.0–77.0)
Platelets: 200 10*3/uL (ref 150.0–400.0)
RBC: 4.57 Mil/uL (ref 4.22–5.81)
RDW: 12.2 % (ref 11.5–15.5)
WBC: 4.3 10*3/uL (ref 4.0–10.5)

## 2016-01-25 LAB — HEPATIC FUNCTION PANEL
ALT: 40 U/L (ref 0–53)
AST: 29 U/L (ref 0–37)
Albumin: 4.5 g/dL (ref 3.5–5.2)
Alkaline Phosphatase: 47 U/L (ref 39–117)
Bilirubin, Direct: 0.2 mg/dL (ref 0.0–0.3)
Total Bilirubin: 0.9 mg/dL (ref 0.2–1.2)
Total Protein: 6.6 g/dL (ref 6.0–8.3)

## 2016-01-25 LAB — LIPID PANEL
Cholesterol: 143 mg/dL (ref 0–200)
HDL: 49.2 mg/dL (ref >39.00)
LDL Cholesterol: 65 mg/dL (ref 0–99)
NonHDL: 93.3
Total CHOL/HDL Ratio: 3
Triglycerides: 144 mg/dL (ref 0.0–149.0)
VLDL: 28.8 mg/dL (ref 0.0–40.0)

## 2016-01-25 LAB — POC URINALSYSI DIPSTICK (AUTOMATED)
Bilirubin, UA: NEGATIVE
Blood, UA: NEGATIVE
Glucose, UA: NEGATIVE
Ketones, UA: NEGATIVE
Leukocytes, UA: NEGATIVE
Nitrite, UA: NEGATIVE
Protein, UA: NEGATIVE
Spec Grav, UA: 1.02
Urobilinogen, UA: 0.2
pH, UA: 6

## 2016-01-25 LAB — PSA: PSA: 0.35 ng/mL (ref 0.10–4.00)

## 2016-01-25 LAB — TSH: TSH: 0.75 u[IU]/mL (ref 0.35–4.50)

## 2016-02-01 ENCOUNTER — Ambulatory Visit (INDEPENDENT_AMBULATORY_CARE_PROVIDER_SITE_OTHER): Payer: BLUE CROSS/BLUE SHIELD | Admitting: Family Medicine

## 2016-02-01 ENCOUNTER — Encounter: Payer: Self-pay | Admitting: Family Medicine

## 2016-02-01 VITALS — BP 144/90 | HR 70 | Temp 98.3°F | Ht 69.75 in | Wt 194.0 lb

## 2016-02-01 DIAGNOSIS — Z23 Encounter for immunization: Secondary | ICD-10-CM

## 2016-02-01 DIAGNOSIS — Z0000 Encounter for general adult medical examination without abnormal findings: Secondary | ICD-10-CM

## 2016-02-01 NOTE — Progress Notes (Signed)
   Subjective:    Patient ID: Todd Reid, male    DOB: [birth year], 55 y.o.   MRN: [id number]  HPI 55 yr old male for a cpx. He feels well. He has no concerns.    Review of Systems  Constitutional: Negative.   HENT: Negative.   Eyes: Negative.   Respiratory: Negative.   Cardiovascular: Negative.   Gastrointestinal: Negative.   Genitourinary: Negative.   Musculoskeletal: Negative.   Skin: Negative.   Neurological: Negative.   Psychiatric/Behavioral: Negative.        Objective:   Physical Exam  Constitutional: He is oriented to person, place, and time. He appears well-developed and well-nourished. No distress.  HENT:  Head: Normocephalic and atraumatic.  Right Ear: External ear normal.  Left Ear: External ear normal.  Nose: Nose normal.  Mouth/Throat: Oropharynx is clear and moist. No oropharyngeal exudate.  Eyes: Conjunctivae and EOM are normal. Pupils are equal, round, and reactive to light. Right eye exhibits no discharge. Left eye exhibits no discharge. No scleral icterus.  Neck: Neck supple. No JVD present. No tracheal deviation present. No thyromegaly present.  Cardiovascular: Normal rate, regular rhythm, normal heart sounds and intact distal pulses.  Exam reveals no gallop and no friction rub.   No murmur heard. EKG normal   Pulmonary/Chest: Effort normal and breath sounds normal. No respiratory distress. He has no wheezes. He has no rales. He exhibits no tenderness.  Abdominal: Soft. Bowel sounds are normal. He exhibits no distension and no mass. There is no tenderness. There is no rebound and no guarding.  Genitourinary: Rectum normal, prostate normal and penis normal. Guaiac negative stool. No penile tenderness.  Musculoskeletal: Normal range of motion. He exhibits no edema or tenderness.  Lymphadenopathy:    He has no cervical adenopathy.  Neurological: He is alert and oriented to person, place, and time. He has normal reflexes. No cranial nerve deficit. He  exhibits normal muscle tone. Coordination normal.  Skin: Skin is warm and dry. No rash noted. He is not diaphoretic. No erythema. No pallor.  Psychiatric: He has a normal mood and affect. His behavior is normal. Judgment and thought content normal.          Assessment & Plan:  Well exam. We discussed diet and exercise. Set up a colonoscopy

## 2016-02-01 NOTE — Progress Notes (Signed)
Pre visit review using our clinic review tool, if applicable. No additional management support is needed unless otherwise documented below in the visit note. 

## 2016-02-01 NOTE — Addendum Note (Signed)
Addended by: Aggie Hacker A on: 02/01/2016 11:09 AM   Modules accepted: Orders

## 2016-04-01 ENCOUNTER — Telehealth: Payer: Self-pay | Admitting: Family Medicine

## 2016-04-01 MED ORDER — TADALAFIL 20 MG PO TABS: 20.0000 mg | ORAL_TABLET | Freq: Every day | ORAL | Status: DC | PRN

## 2016-04-01 NOTE — Telephone Encounter (Signed)
I sent script e-scribe and spoke with pt. 

## 2016-04-01 NOTE — Telephone Encounter (Signed)
Call in #10 with 11 rf 

## 2016-04-01 NOTE — Telephone Encounter (Signed)
Pharmacy called to request a rx tadalafil (CIALIS) 20 MG tablet [id number]   10 tads  Pt has a discount card he would like to use, but it requires a script form the doctor.  [facility] [address]

## 2016-04-20 ENCOUNTER — Other Ambulatory Visit: Payer: Self-pay | Admitting: Family Medicine

## 2016-07-07 DIAGNOSIS — D1801 Hemangioma of skin and subcutaneous tissue: Secondary | ICD-10-CM | POA: Diagnosis not present

## 2016-07-07 DIAGNOSIS — L821 Other seborrheic keratosis: Secondary | ICD-10-CM | POA: Diagnosis not present

## 2016-07-07 DIAGNOSIS — Z85828 Personal history of other malignant neoplasm of skin: Secondary | ICD-10-CM | POA: Diagnosis not present

## 2016-07-07 DIAGNOSIS — L570 Actinic keratosis: Secondary | ICD-10-CM | POA: Diagnosis not present

## 2016-08-22 DIAGNOSIS — Z23 Encounter for immunization: Secondary | ICD-10-CM | POA: Diagnosis not present

## 2016-11-10 ENCOUNTER — Encounter: Payer: Self-pay | Admitting: Gastroenterology

## 2016-12-23 ENCOUNTER — Ambulatory Visit (INDEPENDENT_AMBULATORY_CARE_PROVIDER_SITE_OTHER): Payer: BLUE CROSS/BLUE SHIELD | Admitting: Family Medicine

## 2016-12-23 ENCOUNTER — Encounter: Payer: Self-pay | Admitting: Family Medicine

## 2016-12-23 VITALS — BP 140/97 | HR 50 | Temp 98.1°F | Ht 69.75 in | Wt 201.0 lb

## 2016-12-23 DIAGNOSIS — M542 Cervicalgia: Secondary | ICD-10-CM | POA: Diagnosis not present

## 2016-12-23 MED ORDER — METHYLPREDNISOLONE 4 MG PO TBPK: ORAL_TABLET | ORAL | 0 refills | Status: DC

## 2016-12-23 MED ORDER — METAXALONE 800 MG PO TABS: 800.0000 mg | ORAL_TABLET | Freq: Four times a day (QID) | ORAL | 2 refills | Status: DC | PRN

## 2016-12-23 NOTE — Progress Notes (Signed)
Pre visit review using our clinic review tool, if applicable. No additional management support is needed unless otherwise documented below in the visit note. 

## 2016-12-23 NOTE — Progress Notes (Signed)
   Subjective:    Patient ID: Todd Reid, male    DOB: September 19, 1961, 56 y.o.   MRN: [id number]  HPI Here for 4 days of pain in the right neck which radiates to the right shoulder and upper arm. This started suddenly one morning when he awoke form sleep and attempted to raise his head off the pillow. He heard and felt a sharp "pop" and immediately felt a pain in the right neck. This pain has persisted ever since, though it waxes and wanes. He has tried taking Ibuprofen and he as applied ice packs. He saw Dr. Jola Baptist once a few days ago for chiropractic treatment but this did not help. 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    Review of Systems  Constitutional: Negative.   Musculoskeletal: Positive for neck pain and neck stiffness.  Neurological: Negative.        Objective:   Physical Exam  Constitutional: He is oriented to person, place, and time.  In mild pain   Cardiovascular: Normal rate, regular rhythm, normal heart sounds and intact distal pulses.   Pulmonary/Chest: Effort normal and breath sounds normal.  Musculoskeletal:  He is tender in the lower right posterior neck region and ROM is quite reduced.   Neurological: He is alert and oriented to person, place, and time.          Assessment & Plan:  Neck strain, causing a pinched nerve. He will apply ice packs. Use a Medrol dose pack and Skelaxin to reduce inflammation and spasm. He can try a massage. Recheck prn.

## 2016-12-28 ENCOUNTER — Telehealth: Payer: Self-pay | Admitting: Family Medicine

## 2016-12-28 NOTE — Telephone Encounter (Signed)
° ° °  Pt call to say he is still having back pain the medicine he said only helps for about an hour. He is out of town until tomorrow and is asking if there is another alternative.  Would like a call back   [phone]

## 2016-12-29 NOTE — Telephone Encounter (Signed)
Call in Diclofenac 75 mg to take BID prn pain, #60 with one rf

## 2016-12-29 NOTE — Telephone Encounter (Signed)
Pt calling to check the status of the Rx and would like to have a call.  Pt is aware that it could take up to 3 business days and state that he is not able to sleep and really need something.

## 2016-12-30 ENCOUNTER — Encounter (HOSPITAL_COMMUNITY): Payer: Self-pay | Admitting: Emergency Medicine

## 2016-12-30 ENCOUNTER — Emergency Department (HOSPITAL_COMMUNITY): Payer: BLUE CROSS/BLUE SHIELD

## 2016-12-30 ENCOUNTER — Emergency Department (HOSPITAL_COMMUNITY)
Admission: EM | Admit: 2016-12-30 | Discharge: 2016-12-30 | Disposition: A | Discharge status: Home / Self Care | Payer: BLUE CROSS/BLUE SHIELD | Attending: Emergency Medicine | Admitting: Emergency Medicine

## 2016-12-30 DIAGNOSIS — Y929 Unspecified place or not applicable: Secondary | ICD-10-CM | POA: Insufficient documentation

## 2016-12-30 DIAGNOSIS — M5412 Radiculopathy, cervical region: Secondary | ICD-10-CM | POA: Diagnosis not present

## 2016-12-30 DIAGNOSIS — Y999 Unspecified external cause status: Secondary | ICD-10-CM | POA: Diagnosis not present

## 2016-12-30 DIAGNOSIS — S199XXA Unspecified injury of neck, initial encounter: Secondary | ICD-10-CM | POA: Diagnosis not present

## 2016-12-30 DIAGNOSIS — Y939 Activity, unspecified: Secondary | ICD-10-CM | POA: Diagnosis not present

## 2016-12-30 DIAGNOSIS — Z79899 Other long term (current) drug therapy: Secondary | ICD-10-CM | POA: Insufficient documentation

## 2016-12-30 DIAGNOSIS — I10 Essential (primary) hypertension: Secondary | ICD-10-CM | POA: Insufficient documentation

## 2016-12-30 DIAGNOSIS — X509XXA Other and unspecified overexertion or strenuous movements or postures, initial encounter: Secondary | ICD-10-CM | POA: Diagnosis not present

## 2016-12-30 DIAGNOSIS — M542 Cervicalgia: Secondary | ICD-10-CM | POA: Diagnosis not present

## 2016-12-30 DIAGNOSIS — Z6827 Body mass index (BMI) 27.0-27.9, adult: Secondary | ICD-10-CM | POA: Diagnosis not present

## 2016-12-30 HISTORY — DX: Hyperlipidemia, unspecified: E78.5

## 2016-12-30 HISTORY — DX: Essential (primary) hypertension: I10

## 2016-12-30 HISTORY — DX: Other cervical disc degeneration, unspecified cervical region: M50.30

## 2016-12-30 HISTORY — DX: Other cervical disc displacement, unspecified cervical region: M50.20

## 2016-12-30 MED ORDER — PREDNISONE 20 MG PO TABS: 40.0000 mg | ORAL_TABLET | Freq: Every day | ORAL | 0 refills | Status: DC

## 2016-12-30 MED ORDER — IBUPROFEN 800 MG PO TABS: 800.0000 mg | ORAL_TABLET | Freq: Three times a day (TID) | ORAL | 0 refills | Status: DC

## 2016-12-30 MED ORDER — DIAZEPAM 5 MG/ML IJ SOLN
5.0000 mg | Freq: Once | INTRAMUSCULAR | Status: AC
  Administered 2016-12-30: 5 mg via INTRAMUSCULAR
  Filled 2016-12-30: qty 2

## 2016-12-30 MED ORDER — METHOCARBAMOL 500 MG PO TABS: 500.0000 mg | ORAL_TABLET | Freq: Two times a day (BID) | ORAL | 0 refills | Status: DC

## 2016-12-30 MED ORDER — OXYCODONE-ACETAMINOPHEN 5-325 MG PO TABS
1.0000 | ORAL_TABLET | ORAL | Status: DC | PRN
  Administered 2016-12-30: 1 via ORAL
  Filled 2016-12-30 (×2): qty 1

## 2016-12-30 MED ORDER — DICLOFENAC SODIUM 75 MG PO TBEC: 75.0000 mg | DELAYED_RELEASE_TABLET | Freq: Two times a day (BID) | ORAL | 1 refills | Status: DC | PRN

## 2016-12-30 MED ORDER — HYDROMORPHONE HCL 1 MG/ML IJ SOLN
1.0000 mg | Freq: Once | INTRAMUSCULAR | Status: AC
  Administered 2016-12-30: 1 mg via INTRAMUSCULAR
  Filled 2016-12-30: qty 1

## 2016-12-30 NOTE — ED Provider Notes (Signed)
[facility] DEPT Provider Note   CSN: [id number] Arrival date & time: 12/30/16  0225     History   Chief Complaint Chief Complaint  Patient presents with  . Pinched Nerve    HPI Todd Reid is a 56 y.o. male.  Patient presents to the ED with a chief complaint of pinched nerved.  Patient states that he has bulging discs at C5/6.  He states that he felt a pop in his neck about 2 weeks ago and the symptoms have been worse since then.  He states that he has been seeing a chiropractor for this for the past 18 months.  He states that he recently finished a medrol dose pack.  He reports that the pain radiates from his neck to his arm and occasionally into his right little finger.  He states that the pain is moderate to severe and prohibits him from sleeping.  He reports weakness in his right upper extremity.  There are no other associated symptoms.   The history is provided by the patient. No language interpreter was used.    Past Medical History:  Diagnosis Date  . Bulging of cervical intervertebral disc   . Chicken pox   . Hyperlipidemia   . Hypertension     Patient Active Problem List   Diagnosis Date Noted  . Chronic neck pain 04/30/2015  . Peyronie's syndrome 04/30/2015  . Hyperlipidemia 01/26/2015  . HTN (hypertension) 05/04/2012  . HEMORRHOIDS, EXTERNAL 08/10/2010  . GROIN STRAIN 06/29/2010  . THRUSH 01/22/2009  . CARDIAC MURMUR, HX OF 01/22/2009    Past Surgical History:  Procedure Laterality Date  . ARTHROSCOPIC REPAIR ACL    . bone spurs     removed from right foot  . bunions     removed from right foot  . EYE SURGERY     left eye repair  . KNEE SURGERY     reconstruction  . SHOULDER ARTHROSCOPY W/ LABRAL REPAIR         Home Medications    Prior to Admission medications   Medication Sig Start Date End Date Taking? Authorizing Provider  acetaminophen (TYLENOL) 500 MG tablet Take 500 mg by mouth every 6 (six) hours as needed for moderate pain.    Yes Historical Provider, MD  atorvastatin (LIPITOR) 20 MG tablet Take 1 tablet by mouth  daily 04/20/16  Yes Laurey Morale, MD  lisinopril-hydrochlorothiazide [facility]) 10-12.5 MG tablet Take 1 tablet by mouth  daily 04/20/16  Yes Laurey Morale, MD  metaxalone (SKELAXIN) 800 MG tablet Take 1 tablet (800 mg total) by mouth every 6 (six) hours as needed for muscle spasms. 12/23/16  Yes Laurey Morale, MD  naproxen sodium (ANAPROX) 220 MG tablet Take 220 mg by mouth 2 (two) times daily with a meal.   Yes Historical Provider, MD  TURMERIC PO Take 1 tablet by mouth daily.   Yes Historical Provider, MD  ibuprofen (ADVIL,MOTRIN) 800 MG tablet Take 1 tablet (800 mg total) by mouth 3 (three) times daily. 12/30/16   Montine Circle, PA-C  methocarbamol (ROBAXIN) 500 MG tablet Take 1 tablet (500 mg total) by mouth 2 (two) times daily. 12/30/16   Montine Circle, PA-C  methylPREDNISolone (MEDROL DOSEPAK) 4 MG TBPK tablet As directed Patient not taking: Reported on 12/30/2016 12/23/16   Laurey Morale, MD  predniSONE (DELTASONE) 20 MG tablet Take 2 tablets (40 mg total) by mouth daily. 12/30/16   Montine Circle, PA-C  tadalafil (CIALIS) 20 MG tablet Take 1 tablet (  20 mg total) by mouth daily as needed for erectile dysfunction. Patient not taking: Reported on 12/23/2016 04/01/16   Laurey Morale, MD    Family History Family History  Problem Relation Age of Onset  . Alcohol abuse      Social History Social History  Substance Use Topics  . Smoking status: Never Smoker  . Smokeless tobacco: Never Used  . Alcohol use 0.0 oz/week     Comment: 4-5 nights per week     Allergies   Patient has no known allergies.   Review of Systems Review of Systems  Neurological: Positive for weakness and numbness.  All other systems reviewed and are negative.    Physical Exam Updated Vital Signs BP 144/92 (BP Location: Left Arm)   Pulse 65   Temp 97.4 F (36.3 C) (Oral)   Resp 17   SpO2 98%   Physical Exam   Constitutional: He is oriented to person, place, and time. He appears well-developed and well-nourished.  HENT:  Head: Normocephalic and atraumatic.  Eyes: Conjunctivae and EOM are normal. Pupils are equal, round, and reactive to light. Right eye exhibits no discharge. Left eye exhibits no discharge. No scleral icterus.  Neck: Normal range of motion. Neck supple. No JVD present.  Cardiovascular: Normal rate, regular rhythm and normal heart sounds.  Exam reveals no gallop and no friction rub.   No murmur heard. Intact distal pulses  Pulmonary/Chest: Effort normal and breath sounds normal. No respiratory distress. He has no wheezes. He has no rales. He exhibits no tenderness.  Abdominal: Soft. He exhibits no distension and no mass. There is no tenderness. There is no rebound and no guarding.  Musculoskeletal: Normal range of motion. He exhibits no edema or tenderness.  No cervical spine tenderness, step off, or deformity ROM and strength 5/5 Equal grip strength  Neurological: He is alert and oriented to person, place, and time.  RUE  Sensation 5/5  Skin: Skin is warm and dry.  Psychiatric: He has a normal mood and affect. His behavior is normal. Judgment and thought content normal.  Nursing note and vitals reviewed.    ED Treatments / Results  Labs (all labs ordered are listed, but only abnormal results are displayed) Labs Reviewed - No data to display  EKG  EKG Interpretation None       Radiology Dg Cervical Spine Complete  Result Date: 12/30/2016 CLINICAL DATA:  Patient turned to the neck about 1 week ago and felt a pop posteriorly. Ongoing and worsening neck and right shoulder pain since then. EXAM: CERVICAL SPINE - COMPLETE 4+ VIEW COMPARISON:  None. FINDINGS: Straightening of usual cervical lordosis. This may be due to patient positioning but ligamentous injury or muscle spasm could also have this appearance and are not excluded. No abnormal anterior subluxation. Normal  alignment of the posterior elements. C1-2 articulation appears intact. No significant bone encroachment upon the neural foramina. No vertebral compression deformities. Degenerative changes with disc space narrowing and endplate hypertrophic changes most prominent at C3-4, C4-5, C5-6, and C6-7 levels. No prevertebral soft tissue swelling. No focal bone lesion or bone destruction. IMPRESSION: Nonspecific straightening of usual cervical lordosis. No acute displaced fractures identified. Degenerative changes. Electronically Signed   By: Lucienne Capers M.D.   On: 12/30/2016 05:57    Procedures Procedures (including critical care time)  Medications Ordered in ED Medications  oxyCODONE-acetaminophen (PERCOCET/ROXICET) 5-325 MG per tablet 1 tablet (1 tablet Oral Given 12/30/16 0252)  HYDROmorphone (DILAUDID) injection 1 mg (not administered)  diazepam (VALIUM) injection 5 mg (not administered)     Initial Impression / Assessment and Plan / ED Course  I have reviewed the triage vital signs and the nursing notes.  Pertinent labs & imaging results that were available during my care of the patient were reviewed by me and considered in my medical decision making (see chart for details).     Patient with known bulged discs presents with pain that has been worse for the past 11 days.  Sensation and strength is normal objectively, but patient reports subjective weakness.   Patient needs to have neurosurg follow-up and possibly MRI.  Recommend outpatient follow-up.  No objective deficits in the ER.  Will treat pain in ED and discharge to home with instructions for follow-up.  Final Clinical Impressions(s) / ED Diagnoses   Final diagnoses:  Cervical radiculopathy    New Prescriptions New Prescriptions   IBUPROFEN (ADVIL,MOTRIN) 800 MG TABLET    Take 1 tablet (800 mg total) by mouth 3 (three) times daily.   METHOCARBAMOL (ROBAXIN) 500 MG TABLET    Take 1 tablet (500 mg total) by mouth 2 (two) times  daily.   PREDNISONE (DELTASONE) 20 MG TABLET    Take 2 tablets (40 mg total) by mouth daily.     Montine Circle, PA-C 12/30/16 [id number]    April Palumbo, MD 12/30/16 [phone]

## 2016-12-30 NOTE — ED Triage Notes (Signed)
Pt c/o neck pain from pinched nerve; pt was dx at PCP last Friday and prescribed metaxalone; pt has 2 bulging discs at C5 and C6 that he sees a chiropractor for; pt moved his neck 11 days ago and heard a "pop" and has been having increased issues with pain since then; ambulatory; denies tingling and numbness of extremities

## 2016-12-30 NOTE — ED Notes (Signed)
Pt has been dealing with pain for 10 days, went to his doctor and hs chiropractor for evaluation was told it was a possible muscle issue. Pt has small anxiety and wants to find pain relief and medical assessment ( scared of stroke/ myocardial issue).  Pt placed on 12 lead, hot pack provided for back.

## 2016-12-30 NOTE — ED Notes (Signed)
Provider in room  

## 2016-12-30 NOTE — Telephone Encounter (Signed)
[facility] and left a voice message for pt with this information.  

## 2016-12-30 NOTE — ED Notes (Signed)
Pt back from x-ray.

## 2016-12-30 NOTE — ED Notes (Signed)
Pt in xray

## 2017-01-02 MED ORDER — TRAMADOL HCL 50 MG PO TABS: 100.0000 mg | ORAL_TABLET | Freq: Four times a day (QID) | ORAL | 1 refills | Status: DC | PRN

## 2017-01-02 NOTE — Telephone Encounter (Signed)
[facility] and spoke with [facility] 

## 2017-01-02 NOTE — Addendum Note (Signed)
Addended by: Alysia Penna A on: 01/02/2017 12:50 PM   Modules accepted: Orders

## 2017-01-02 NOTE — Telephone Encounter (Signed)
Tell him that we will cal in stronger pain medication. Call in Tramadol 50 mg to take 2 tabs every 6 hours prn pain, #60 with one refill. Also I will order an MRI of the cervical spine to see what the problem is.

## 2017-01-02 NOTE — Telephone Encounter (Signed)
Pt state that he is not feeling any better and went to the ER before getting your Rx and the medication that they gave him methocarbamol 500 mg and ibuprofen 800 mg.  And pt would like to come in today because he is not feeling any better from the time he last seen Dr. Sarajane Jews pt is aware Dr. Sarajane Jews does not have any appointments today and he would like to try the alternative method.

## 2017-01-02 NOTE — Addendum Note (Signed)
Addended by: Aggie Hacker A on: 01/02/2017 03:27 PM   Modules accepted: Orders

## 2017-01-03 ENCOUNTER — Ambulatory Visit (AMBULATORY_SURGERY_CENTER): Payer: Self-pay

## 2017-01-03 VITALS — Ht 70.0 in | Wt 197.6 lb

## 2017-01-03 DIAGNOSIS — Z1211 Encounter for screening for malignant neoplasm of colon: Secondary | ICD-10-CM

## 2017-01-03 MED ORDER — NA SULFATE-K SULFATE-MG SULF 17.5-3.13-1.6 GM/177ML PO SOLN: 1.0000 | Freq: Once | ORAL | 0 refills | Status: AC

## 2017-01-03 NOTE — Progress Notes (Signed)
Patient ID: Todd Reid, male   DOB: [birth year], 56 y.o.   MRN: [id number]   Patient denies allergies to eggs and soy.  Patient is not on diet pills. Patient denies problems with sedation or anesthesia.  Patient not on home 02.  Emmi information given to patient.

## 2017-01-04 ENCOUNTER — Encounter: Payer: Self-pay | Admitting: Gastroenterology

## 2017-01-05 ENCOUNTER — Other Ambulatory Visit: Payer: Self-pay

## 2017-01-05 ENCOUNTER — Ambulatory Visit
Admission: RE | Admit: 2017-01-05 | Discharge: 2017-01-05 | Disposition: A | Discharge status: Home / Self Care | Payer: BLUE CROSS/BLUE SHIELD | Source: Ambulatory Visit | Attending: Family Medicine | Admitting: Family Medicine

## 2017-01-05 ENCOUNTER — Telehealth: Payer: Self-pay | Admitting: Gastroenterology

## 2017-01-05 DIAGNOSIS — M4802 Spinal stenosis, cervical region: Secondary | ICD-10-CM | POA: Diagnosis not present

## 2017-01-05 DIAGNOSIS — M542 Cervicalgia: Secondary | ICD-10-CM

## 2017-01-05 NOTE — Addendum Note (Signed)
Addended by: Alysia Penna A on: 01/05/2017 01:12 PM   Modules accepted: Orders

## 2017-01-05 NOTE — Telephone Encounter (Signed)
[facility]bate [facility] [employer] [facility] [address]

## 2017-01-10 DIAGNOSIS — M542 Cervicalgia: Secondary | ICD-10-CM | POA: Diagnosis not present

## 2017-01-12 HISTORY — PX: CERVICAL DISC SURGERY: SHX588

## 2017-01-13 ENCOUNTER — Ambulatory Visit (AMBULATORY_SURGERY_CENTER): Payer: BLUE CROSS/BLUE SHIELD | Admitting: Gastroenterology

## 2017-01-13 ENCOUNTER — Encounter: Payer: Self-pay | Admitting: Gastroenterology

## 2017-01-13 VITALS — BP 121/80 | HR 61 | Temp 96.8°F | Resp 18 | Ht 70.0 in | Wt 197.0 lb

## 2017-01-13 DIAGNOSIS — Z1212 Encounter for screening for malignant neoplasm of rectum: Secondary | ICD-10-CM

## 2017-01-13 DIAGNOSIS — K621 Rectal polyp: Secondary | ICD-10-CM

## 2017-01-13 DIAGNOSIS — D123 Benign neoplasm of transverse colon: Secondary | ICD-10-CM

## 2017-01-13 DIAGNOSIS — D126 Benign neoplasm of colon, unspecified: Secondary | ICD-10-CM | POA: Diagnosis not present

## 2017-01-13 DIAGNOSIS — K635 Polyp of colon: Secondary | ICD-10-CM

## 2017-01-13 DIAGNOSIS — Z1211 Encounter for screening for malignant neoplasm of colon: Secondary | ICD-10-CM | POA: Diagnosis not present

## 2017-01-13 DIAGNOSIS — D125 Benign neoplasm of sigmoid colon: Secondary | ICD-10-CM

## 2017-01-13 DIAGNOSIS — D128 Benign neoplasm of rectum: Secondary | ICD-10-CM

## 2017-01-13 DIAGNOSIS — D129 Benign neoplasm of anus and anal canal: Secondary | ICD-10-CM

## 2017-01-13 HISTORY — PX: COLONOSCOPY: SHX174

## 2017-01-13 MED ORDER — SODIUM CHLORIDE 0.9 % IV SOLN: 500.0000 mL | INTRAVENOUS | Status: DC

## 2017-01-13 NOTE — Progress Notes (Signed)
To recovery vss report to [facility]

## 2017-01-13 NOTE — Progress Notes (Signed)
Called to room to assist during endoscopic procedure.  Patient ID and intended procedure confirmed with present staff. Received instructions for my participation in the procedure from the performing physician.  

## 2017-01-13 NOTE — Progress Notes (Signed)
PT HAD RED BLOOD IN COMMODE WHEN WENT TO BATHROOM AFTER DRESSED . DR ARMBRUSTER INFORMED OF BRIGHT RED BLOOD AND IS COMING TO TALK W /PT PRIOR TO D/C

## 2017-01-13 NOTE — Patient Instructions (Addendum)
YOU HAD AN ENDOSCOPIC PROCEDURE TODAY AT [facility]:   Refer to the procedure report that was given to you for any specific questions about what was found during the examination.  If the procedure report does not answer your questions, please call your gastroenterologist to clarify.  If you requested that your care partner not be given the details of your procedure findings, then the procedure report has been included in a sealed envelope for you to review at your convenience later.  YOU SHOULD EXPECT: Some feelings of bloating in the abdomen. Passage of more gas than usual.  Walking can help get rid of the air that was put into your GI tract during the procedure and reduce the bloating. If you had a lower endoscopy (such as a colonoscopy or flexible sigmoidoscopy) you may notice spotting of blood in your stool or on the toilet paper. If you underwent a bowel prep for your procedure, you may not have a normal bowel movement for a few days.  Please Note:  You might notice some irritation and congestion in your nose or some drainage.  This is from the oxygen used during your procedure.  There is no need for concern and it should clear up in a day or so.  SYMPTOMS TO REPORT IMMEDIATELY:   Following lower endoscopy (colonoscopy or flexible sigmoidoscopy):  Excessive amounts of blood in the stool  Significant tenderness or worsening of abdominal pains  Swelling of the abdomen that is new, acute  Fever of 100F or higher    For urgent or emergent issues, a gastroenterologist can be reached at any hour by calling [phone].   DIET:  We do recommend a small meal at first, but then you may proceed to your regular diet.  Drink plenty of fluids but you should avoid alcoholic beverages for 24 hours.  ACTIVITY:  You should plan to take it easy for the rest of today and you should NOT DRIVE or use heavy machinery until tomorrow (because of the sedation medicines used during the test).     FOLLOW UP: Our staff will call the number listed on your records the next business day following your procedure to check on you and address any questions or concerns that you may have regarding the information given to you following your procedure. If we do not reach you, we will leave a message.  However, if you are feeling well and you are not experiencing any problems, there is no need to return our call.  We will assume that you have returned to your regular daily activities without incident.  If any biopsies were taken you will be contacted by phone or by letter within the next 1-3 weeks.  Please call us at [phone] if you have not heard about the biopsies in 3 weeks.    SIGNATURES/CONFIDENTIALITY: You and/or your care partner have signed paperwork which will be entered into your electronic medical record.  These signatures attest to the fact that that the information above on your After Visit Summary has been reviewed and is understood.  Full responsibility of the confidentiality of this discharge information lies with you and/or your care-partner.   Information on polyps and hemorrhoids given  to you today  NO IBUPROFEN,NAPROXEN,OR OTHER NON STEROIDAL INFLAMMATORY DRUGS FOR 2 WEEKS AFTER POLYP REMOVAL  AWAIT PATHOLOGY RESULTS  INFORMATION ON HEMORRHOIDAL BANDING GIVEN TO YOU   HIGH FIBER DIET INFORMATION GIVEN TO YOU ,iF NOT ABLE TO GET ENOUGH FIBER IN  YOUR DIET ,TAKE FIBER SUPPLEMENT DAILY WITH INCREASED WATER INTAKE

## 2017-01-13 NOTE — Op Note (Signed)
[place] Patient Name: Todd Reid Procedure Date: 01/13/2017 9:22 AM MRN: [id number] Endoscopist: Remo Lipps P. Sumedh Shinsato MD, MD Age: 56 Referring MD:  Date of Birth: [birth year] Gender: Male Account #: [id number] Procedure:                Colonoscopy Indications:              Screening for colorectal malignant neoplasm, This                            is the patient's first colonoscopy Medicines:                Monitored Anesthesia Care Procedure:                Pre-Anesthesia Assessment:                           - Prior to the procedure, a History and Physical                            was performed, and patient medications and                            allergies were reviewed. The patient's tolerance of                            previous anesthesia was also reviewed. The risks                            and benefits of the procedure and the sedation                            options and risks were discussed with the patient.                            All questions were answered, and informed consent                            was obtained. Prior Anticoagulants: The patient has                            taken no previous anticoagulant or antiplatelet                            agents. ASA Grade Assessment: II - A patient with                            mild systemic disease. After reviewing the risks                            and benefits, the patient was deemed in                            satisfactory condition to undergo the procedure.  After obtaining informed consent, the colonoscope                            was passed under direct vision. Throughout the                            procedure, the patient's blood pressure, pulse, and                            oxygen saturations were monitored continuously. The                            Colonoscope was introduced through the anus and                            advanced to the the  cecum, identified by                            appendiceal orifice and ileocecal valve. The                            colonoscopy was performed without difficulty. The                            patient tolerated the procedure well. The quality                            of the bowel preparation was good. The ileocecal                            valve, appendiceal orifice, and rectum were                            photographed. Scope In: 9:31:01 AM Scope Out: 9:57:58 AM Scope Withdrawal Time: 0 hours 23 minutes 23 seconds  Total Procedure Duration: 0 hours 26 minutes 57 seconds  Findings:                 The perianal exam findings include non-thrombosed                            external hemorrhoids.                           A 8 mm polyp was found in the hepatic flexure. The                            polyp was sessile. The polyp was removed with a                            cold snare. Resection and retrieval were complete.                           A 6 mm polyp was found in the transverse colon. The  polyp was sessile. The polyp was removed with a                            cold snare. Resection and retrieval were complete.                           Two flat polyps were found in the sigmoid colon.                            The polyps were 4 to 6 mm in size. These polyps                            were removed with a cold snare. Resection and                            retrieval were complete.                           A 3 mm polyp was found in the rectum. The polyp was                            sessile. The polyp was removed with a cold snare.                            Resection and retrieval were complete.                           Internal hemorrhoids were found during retroflexion.                           There was spasm in the transverse and left colon                            which prolonged the procedure. The exam was                             otherwise without abnormality. Complications:            No immediate complications. Estimated blood loss:                            Minimal. Estimated Blood Loss:     Estimated blood loss was minimal. Impression:               - Non-thrombosed external hemorrhoids found on                            perianal exam.                           - One 8 mm polyp at the hepatic flexure, removed                            with a cold snare. Resected and retrieved.                           -  One 6 mm polyp in the transverse colon, removed                            with a cold snare. Resected and retrieved.                           - Two 4 to 6 mm polyps in the sigmoid colon,                            removed with a cold snare. Resected and retrieved.                           - One 3 mm polyp in the rectum, removed with a cold                            snare. Resected and retrieved.                           - Internal hemorrhoids.                           - Spastic colon                           - The examination was otherwise normal. Recommendation:           - Patient has a contact number available for                            emergencies. The signs and symptoms of potential                            delayed complications were discussed with the                            patient. Return to normal activities tomorrow.                            Written discharge instructions were provided to the                            patient.                           - Resume previous diet.                           - Continue present medications.                           - No ibuprofen, naproxen, or other non-steroidal                            anti-inflammatory drugs for 2 weeks after polyp  removal.                           - Await pathology results.                           - Repeat colonoscopy is recommended for                            surveillance.  The colonoscopy date will be                            determined after pathology results from today's                            exam become available for review. Remo Lipps P. Brina Umeda MD, MD 01/13/2017 10:02:48 AM This report has been signed electronically.

## 2017-01-13 NOTE — Progress Notes (Signed)
Pt's states no medical or surgical changes since previsit or office visit. 

## 2017-01-16 ENCOUNTER — Telehealth: Payer: Self-pay | Admitting: *Deleted

## 2017-01-16 NOTE — Telephone Encounter (Signed)
  Follow up Call-  Call back number 01/13/2017  Post procedure Call Back phone  # [phone]  Permission to leave phone message Yes  Some recent data might be hidden     Patient questions:  Do you have a fever, pain , or abdominal swelling? No. Pain Score  0 *  Have you tolerated food without any problems? Yes.    Have you been able to return to your normal activities? Yes.    Do you have any questions about your discharge instructions: Diet   No. Medications  Yes.   Follow up visit  No.  Do you have questions or concerns about your Care? No.  Actions: * If pain score is 4 or above: No action needed, pain <4.  Pt. Wanted to know what pain relievers he could take other than the NSAIDS,informed him that he could take tylenol if needed,verbalized understanding.

## 2017-01-20 ENCOUNTER — Encounter: Payer: Self-pay | Admitting: Gastroenterology

## 2017-02-01 DIAGNOSIS — Z01812 Encounter for preprocedural laboratory examination: Secondary | ICD-10-CM | POA: Diagnosis not present

## 2017-02-01 DIAGNOSIS — M542 Cervicalgia: Secondary | ICD-10-CM | POA: Diagnosis not present

## 2017-02-06 DIAGNOSIS — M50222 Other cervical disc displacement at C5-C6 level: Secondary | ICD-10-CM | POA: Diagnosis not present

## 2017-02-06 DIAGNOSIS — M542 Cervicalgia: Secondary | ICD-10-CM | POA: Diagnosis not present

## 2017-02-06 DIAGNOSIS — M5020 Other cervical disc displacement, unspecified cervical region: Secondary | ICD-10-CM | POA: Diagnosis not present

## 2017-02-20 DIAGNOSIS — M542 Cervicalgia: Secondary | ICD-10-CM | POA: Diagnosis not present

## 2017-05-08 ENCOUNTER — Ambulatory Visit (INDEPENDENT_AMBULATORY_CARE_PROVIDER_SITE_OTHER): Payer: BLUE CROSS/BLUE SHIELD | Admitting: Family Medicine

## 2017-05-08 ENCOUNTER — Encounter: Payer: Self-pay | Admitting: Family Medicine

## 2017-05-08 VITALS — BP 120/80 | HR 73 | Temp 98.2°F | Ht 70.0 in | Wt 200.9 lb

## 2017-05-08 DIAGNOSIS — J988 Other specified respiratory disorders: Secondary | ICD-10-CM | POA: Diagnosis not present

## 2017-05-08 MED ORDER — BENZONATATE 100 MG PO CAPS: 100.0000 mg | ORAL_CAPSULE | Freq: Two times a day (BID) | ORAL | 0 refills | Status: DC

## 2017-05-08 MED ORDER — DOXYCYCLINE HYCLATE 100 MG PO TABS: 100.0000 mg | ORAL_TABLET | Freq: Two times a day (BID) | ORAL | 0 refills | Status: DC

## 2017-05-08 NOTE — Patient Instructions (Signed)
Schedule follow up in about 2-3 weeks.  Start the antibiotic today.  Cough medication as needed.  I hope you are feeling better soon! Seek care immediately if worsening, new concerns or you are not improving over the next 24-48 hours with treatment.

## 2017-05-08 NOTE — Progress Notes (Signed)
HPI:  Acute visit for cough: -started: 1 week ago -symptoms:cough, productive of green mucus, gurgling in chest -denies:fever, SOB, NVD, tooth pain, DOE, wheezing -has tried: OTC options - not helping -sick contacts/travel/risks: no reported flu, strep or tick exposure  ROS: See pertinent positives and negatives per HPI.  Past Medical History:  Diagnosis Date  . Arthritis   . Bulging of cervical intervertebral disc   . Chicken pox   . Hyperlipidemia   . Hypertension   . Injury, nerve, musculocutaneous    Pinched nerve right side of neck.    Past Surgical History:  Procedure Laterality Date  . ARTHROSCOPIC REPAIR ACL    . bone spurs     removed from right foot  . bunions     removed from right foot  . EYE SURGERY     left eye repair  . KNEE SURGERY     reconstruction  . SHOULDER ARTHROSCOPY W/ LABRAL REPAIR      Family History  Problem Relation Age of Onset  . Alcohol abuse Unknown   . Colon cancer Neg Hx     Social History   Social History  . Marital status: Married    Spouse name: N/A  . Number of children: N/A  . Years of education: N/A   Social History Main Topics  . Smoking status: Never Smoker  . Smokeless tobacco: Never Used  . Alcohol use 0.0 oz/week     Comment: 4-5 nights per week  . Drug use: No  . Sexual activity: Not Asked   Other Topics Concern  . None   Social History Narrative  . None     Current Outpatient Prescriptions:  .  atorvastatin (LIPITOR) 20 MG tablet, Take 1 tablet by mouth  daily, Disp: 90 tablet, Rfl: 3 .  lisinopril-hydrochlorothiazide (PRINZIDE,ZESTORETIC) 10-12.5 MG tablet, Take 1 tablet by mouth  daily, Disp: 90 tablet, Rfl: 3 .  tadalafil (CIALIS) 20 MG tablet, Take 1 tablet (20 mg total) by mouth daily as needed for erectile dysfunction., Disp: 10 tablet, Rfl: 11 .  TURMERIC PO, Take 1 tablet by mouth daily., Disp: , Rfl:  .  benzonatate (TESSALON PERLES) 100 MG capsule, Take 1 capsule (100 mg total) by mouth  2 (two) times daily., Disp: 20 capsule, Rfl: 0 .  doxycycline (VIBRA-TABS) 100 MG tablet, Take 1 tablet (100 mg total) by mouth 2 (two) times daily., Disp: 14 tablet, Rfl: 0  Current Facility-Administered Medications:  .  0.9 %  sodium chloride infusion, 500 mL, Intravenous, Continuous, Armbruster, Renelda Loma, MD  EXAM:  Vitals:   05/08/17 1454  BP: 120/80  Pulse: 73  Temp: 98.2 F (36.8 C)    Body mass index is 28.83 kg/m.  GENERAL: vitals reviewed and listed above, alert, oriented, appears well hydrated and in no acute distress  HEENT: atraumatic, conjunttiva clear, no obvious abnormalities on inspection of external nose and ears, normal appearance of ear canals and TMs, clear nasal congestion, mild post oropharyngeal erythema with PND, no tonsillar edema or exudate, no sinus TTP  NECK: no obvious masses on inspection  LUNGS: roncherous breath sounds R base, no resp distress, O2 sats 98% on RA  CV: HRRR, no peripheral edema  MS: moves all extremities without noticeable abnormality  PSYCH: pleasant and cooperative, no obvious depression or anxiety  ASSESSMENT AND PLAN:  Discussed the following assessment and plan:  Respiratory infection  -we discussed possible serious and likely etiologies, workup and treatment, treatment risks and return precautions -  after this discussion, Todd Reid opted for empiric tx resp illness, possible CAP with doxy and cough medications, declined CXR -follow up advised in 2-3 weeks for repeat lung exam  -of course, we advised Todd Reid  to return or notify a doctor immediately if symptoms worsen or persist or new concerns arise. Advised reeval also if symptoms not improving significant on treatment over the next 24-48 hours.    Patient Instructions  Schedule follow up in about 2-3 weeks.  Start the antibiotic today.  Cough medication as needed.  I hope you are feeling better soon! Seek care immediately if worsening, new concerns or you are not  improving over the next 24-48 hours with treatment.     Todd Reid R., DO

## 2017-05-10 DIAGNOSIS — J40 Bronchitis, not specified as acute or chronic: Secondary | ICD-10-CM | POA: Diagnosis not present

## 2017-05-10 DIAGNOSIS — R062 Wheezing: Secondary | ICD-10-CM | POA: Diagnosis not present

## 2017-05-10 DIAGNOSIS — Z008 Encounter for other general examination: Secondary | ICD-10-CM | POA: Diagnosis not present

## 2017-05-29 ENCOUNTER — Encounter: Payer: Self-pay | Admitting: Family Medicine

## 2017-05-29 ENCOUNTER — Ambulatory Visit (INDEPENDENT_AMBULATORY_CARE_PROVIDER_SITE_OTHER): Payer: BLUE CROSS/BLUE SHIELD | Admitting: Family Medicine

## 2017-05-29 VITALS — BP 144/99 | HR 52 | Temp 97.9°F | Ht 70.0 in | Wt 197.0 lb

## 2017-05-29 DIAGNOSIS — J181 Lobar pneumonia, unspecified organism: Secondary | ICD-10-CM

## 2017-05-29 DIAGNOSIS — J189 Pneumonia, unspecified organism: Secondary | ICD-10-CM

## 2017-05-29 MED ORDER — AMOXICILLIN-POT CLAVULANATE 875-125 MG PO TABS: 1.0000 | ORAL_TABLET | Freq: Two times a day (BID) | ORAL | 0 refills | Status: DC

## 2017-05-29 NOTE — Progress Notes (Signed)
   Subjective:    Patient ID: Todd Reid, male    DOB: 11/27/1960, 56 y.o.   MRN: [id number]  HPI Here to follow up a possible pneumonia. He was seen here on 05-08-17 and was given 10 days ofDoxycycline. He then saw a nurse at his work on 05-15-17 because of chest tightness and he was given a nebulizer treatment and was started on a prednisone taper. Today he feels much better but still has some chest congestion and a dry cough. No fever.    Review of Systems  Constitutional: Negative.   Respiratory: Positive for cough and chest tightness. Negative for shortness of breath and wheezing.   Cardiovascular: Negative.   Neurological: Negative.        Objective:   Physical Exam  Constitutional: He appears well-developed and well-nourished. No distress.  Cardiovascular: Normal rate, regular rhythm, normal heart sounds and intact distal pulses.   Pulmonary/Chest: Effort normal. No respiratory distress. He has no wheezes.  Soft rales at the right posterior base           Assessment & Plan:  Partially treated RLL pneumonia. We will give him 10 days of Augmentin. He will return in a few weeks for a well exam, and we will recheck him then.  Alysia Penna, MD

## 2017-05-29 NOTE — Patient Instructions (Signed)
WE NOW OFFER   [facility] FAST TRACK!!!  SAME DAY Appointments for ACUTE CARE  Such as: Sprains, Injuries, cuts, abrasions, rashes, muscle pain, joint pain, back pain Colds, flu, sore throats, headache, allergies, cough, fever  Ear pain, sinus and eye infections Abdominal pain, nausea, vomiting, diarrhea, upset stomach Animal/insect bites  3 Easy Ways to Schedule: Walk-In Scheduling Call in scheduling Mychart Sign-up: [email or web address]         

## 2017-06-19 ENCOUNTER — Other Ambulatory Visit: Payer: Self-pay | Admitting: Family Medicine

## 2017-06-23 ENCOUNTER — Encounter: Payer: Self-pay | Admitting: Neurology

## 2017-06-23 ENCOUNTER — Ambulatory Visit (INDEPENDENT_AMBULATORY_CARE_PROVIDER_SITE_OTHER): Payer: BLUE CROSS/BLUE SHIELD | Admitting: Family Medicine

## 2017-06-23 ENCOUNTER — Encounter: Payer: Self-pay | Admitting: Family Medicine

## 2017-06-23 VITALS — BP 135/90 | HR 60 | Temp 98.0°F | Ht 70.0 in | Wt 197.0 lb

## 2017-06-23 DIAGNOSIS — R42 Dizziness and giddiness: Secondary | ICD-10-CM

## 2017-06-23 DIAGNOSIS — Z0000 Encounter for general adult medical examination without abnormal findings: Secondary | ICD-10-CM

## 2017-06-23 DIAGNOSIS — Z125 Encounter for screening for malignant neoplasm of prostate: Secondary | ICD-10-CM

## 2017-06-23 LAB — BASIC METABOLIC PANEL
BUN: 11 mg/dL (ref 6–23)
CO2: 33 mEq/L — ABNORMAL HIGH (ref 19–32)
Calcium: 9.2 mg/dL (ref 8.4–10.5)
Chloride: 99 mEq/L (ref 96–112)
Creatinine, Ser: 0.88 mg/dL (ref 0.40–1.50)
GFR: 96.04 mL/min (ref >60.00)
Glucose, Bld: 96 mg/dL (ref 70–99)
Potassium: 4 mEq/L (ref 3.5–5.1)
Sodium: 136 mEq/L (ref 135–145)

## 2017-06-23 LAB — CBC WITH DIFFERENTIAL/PLATELET
Basophils Absolute: 0 10*3/uL (ref 0.0–0.1)
Basophils Relative: 0.9 % (ref 0.0–3.0)
Eosinophils Absolute: 0.1 10*3/uL (ref 0.0–0.7)
Eosinophils Relative: 3.3 % (ref 0.0–5.0)
HCT: 45.4 % (ref 39.0–52.0)
Hemoglobin: 15.8 g/dL (ref 13.0–17.0)
Lymphocytes Relative: 29.6 % (ref 12.0–46.0)
Lymphs Abs: 1.1 10*3/uL (ref 0.7–4.0)
MCHC: 34.8 g/dL (ref 30.0–36.0)
MCV: 96.1 fl (ref 78.0–100.0)
Monocytes Absolute: 0.4 10*3/uL (ref 0.1–1.0)
Monocytes Relative: 10.7 % (ref 3.0–12.0)
Neutro Abs: 2.1 10*3/uL (ref 1.4–7.7)
Neutrophils Relative %: 55.5 % (ref 43.0–77.0)
Platelets: 200 10*3/uL (ref 150.0–400.0)
RBC: 4.72 Mil/uL (ref 4.22–5.81)
RDW: 12.6 % (ref 11.5–15.5)
WBC: 3.7 10*3/uL — ABNORMAL LOW (ref 4.0–10.5)

## 2017-06-23 LAB — HEPATIC FUNCTION PANEL
ALT: 41 U/L (ref 0–53)
AST: 31 U/L (ref 0–37)
Albumin: 4.5 g/dL (ref 3.5–5.2)
Alkaline Phosphatase: 59 U/L (ref 39–117)
Bilirubin, Direct: 0.2 mg/dL (ref 0.0–0.3)
Total Bilirubin: 0.9 mg/dL (ref 0.2–1.2)
Total Protein: 6.5 g/dL (ref 6.0–8.3)

## 2017-06-23 LAB — POC URINALSYSI DIPSTICK (AUTOMATED)
Bilirubin, UA: NEGATIVE
Blood, UA: NEGATIVE
Glucose, UA: NEGATIVE
Ketones, UA: NEGATIVE
Leukocytes, UA: NEGATIVE
Nitrite, UA: NEGATIVE
Protein, UA: NEGATIVE
Spec Grav, UA: 1.01 (ref 1.010–1.025)
Urobilinogen, UA: 0.2 E.U./dL
pH, UA: 6 (ref 5.0–8.0)

## 2017-06-23 LAB — LIPID PANEL
Cholesterol: 148 mg/dL (ref 0–200)
HDL: 44.3 mg/dL (ref >39.00)
LDL Cholesterol: 87 mg/dL (ref 0–99)
NonHDL: 103.69
Total CHOL/HDL Ratio: 3
Triglycerides: 82 mg/dL (ref 0.0–149.0)
VLDL: 16.4 mg/dL (ref 0.0–40.0)

## 2017-06-23 LAB — PSA: PSA: 0.38 ng/mL (ref 0.10–4.00)

## 2017-06-23 LAB — TSH: TSH: 0.47 u[IU]/mL (ref 0.35–4.50)

## 2017-06-23 MED ORDER — AMLODIPINE BESYLATE 5 MG PO TABS: 5.0000 mg | ORAL_TABLET | Freq: Every day | ORAL | 3 refills | Status: DC

## 2017-06-23 MED ORDER — TADALAFIL 20 MG PO TABS: 20.0000 mg | ORAL_TABLET | Freq: Every day | ORAL | 11 refills | Status: DC | PRN

## 2017-06-23 NOTE — Patient Instructions (Signed)
WE NOW OFFER   [facility] FAST TRACK!!!  SAME DAY Appointments for ACUTE CARE  Such as: Sprains, Injuries, cuts, abrasions, rashes, muscle pain, joint pain, back pain Colds, flu, sore throats, headache, allergies, cough, fever  Ear pain, sinus and eye infections Abdominal pain, nausea, vomiting, diarrhea, upset stomach Animal/insect bites  3 Easy Ways to Schedule: Walk-In Scheduling Call in scheduling Mychart Sign-up: [email or web address]         

## 2017-06-23 NOTE — Progress Notes (Signed)
   Subjective:    Patient ID: Todd Reid, male    DOB: 11-24-1960, 56 y.o.   MRN: [id number]  HPI Here for a well exam. He feels well except for 2 issues. First he often feels dizzy, and this is more pronounced when he moves his head quickly or when he stands up. This has been going on for years but he has never mentioned this to anyone. No headache, no other neurologic issues. Second he has had a dry tickling cough in the back of his throat for the past 3 months.    Review of Systems  Constitutional: Negative.   HENT: Negative.   Eyes: Negative.   Respiratory: Positive for cough. Negative for apnea, choking, chest tightness, shortness of breath, wheezing and stridor.   Cardiovascular: Negative.   Gastrointestinal: Negative.   Genitourinary: Negative.   Musculoskeletal: Negative.   Skin: Negative.   Neurological: Positive for dizziness. Negative for tremors, seizures, syncope, facial asymmetry, speech difficulty, weakness, light-headedness, numbness and headaches.  Psychiatric/Behavioral: Negative.        Objective:   Physical Exam  Constitutional: He is oriented to person, place, and time. He appears well-developed and well-nourished. No distress.  HENT:  Head: Normocephalic and atraumatic.  Right Ear: External ear normal.  Left Ear: External ear normal.  Nose: Nose normal.  Mouth/Throat: Oropharynx is clear and moist. No oropharyngeal exudate.  Eyes: Pupils are equal, round, and reactive to light. Conjunctivae and EOM are normal. Right eye exhibits no discharge. Left eye exhibits no discharge. No scleral icterus.  Neck: Neck supple. No JVD present. No tracheal deviation present. No thyromegaly present.  Cardiovascular: Normal rate, regular rhythm, normal heart sounds and intact distal pulses.  Exam reveals no gallop and no friction rub.   No murmur heard. Pulmonary/Chest: Effort normal and breath sounds normal. No respiratory distress. He has no wheezes. He has no rales. He  exhibits no tenderness.  Abdominal: Soft. Bowel sounds are normal. He exhibits no distension and no mass. There is no tenderness. There is no rebound and no guarding.  Genitourinary: Rectum normal, prostate normal and penis normal. Rectal exam shows guaiac negative stool. No penile tenderness.  Musculoskeletal: Normal range of motion. He exhibits no edema or tenderness.  Lymphadenopathy:    He has no cervical adenopathy.  Neurological: He is alert and oriented to person, place, and time. He has normal reflexes. No cranial nerve deficit. He exhibits normal muscle tone. Coordination normal.  Skin: Skin is warm and dry. No rash noted. He is not diaphoretic. No erythema. No pallor.  Psychiatric: He has a normal mood and affect. His behavior is normal. Judgment and thought content normal.          Assessment & Plan:  Well exam. We discussed diet and exercise. Get fasting labs. For the dizziness, we will refer him to Neurology to evaluate. As for his cough, I believe this is a side effect of his Lisinopril HCT so we will stop this. Start Amlodipine 5 mg daily and recheck in 3-4 weeks.  Alysia Penna, MD

## 2017-06-26 ENCOUNTER — Telehealth: Payer: Self-pay | Admitting: Family Medicine

## 2017-06-26 NOTE — Telephone Encounter (Signed)
Pt is returning sylvia call °

## 2017-06-26 NOTE — Telephone Encounter (Signed)
I left a voice message on pt's cell with normal results.

## 2017-07-13 DIAGNOSIS — L814 Other melanin hyperpigmentation: Secondary | ICD-10-CM | POA: Diagnosis not present

## 2017-07-13 DIAGNOSIS — L570 Actinic keratosis: Secondary | ICD-10-CM | POA: Diagnosis not present

## 2017-07-13 DIAGNOSIS — D1801 Hemangioma of skin and subcutaneous tissue: Secondary | ICD-10-CM | POA: Diagnosis not present

## 2017-07-13 DIAGNOSIS — Z85828 Personal history of other malignant neoplasm of skin: Secondary | ICD-10-CM | POA: Diagnosis not present

## 2017-08-16 ENCOUNTER — Telehealth: Payer: Self-pay | Admitting: Family Medicine

## 2017-08-16 NOTE — Telephone Encounter (Signed)
Stop Cialis. Call in generic Sildenafil 20 mg to take 5 tabs prn, #50 with 11 rf

## 2017-08-16 NOTE — Telephone Encounter (Signed)
Pt would like a new rx generic viagra instead of [facility]ialis. [facility] [address]

## 2017-08-18 MED ORDER — SILDENAFIL CITRATE 20 MG PO TABS: 100.0000 mg | ORAL_TABLET | Freq: Every day | ORAL | 11 refills | Status: DC | PRN

## 2017-08-18 NOTE — Telephone Encounter (Signed)
[facility] from current list.

## 2017-08-28 DIAGNOSIS — Z008 Encounter for other general examination: Secondary | ICD-10-CM | POA: Diagnosis not present

## 2017-09-27 ENCOUNTER — Ambulatory Visit: Payer: BLUE CROSS/BLUE SHIELD | Admitting: Neurology

## 2017-10-11 ENCOUNTER — Other Ambulatory Visit: Payer: Self-pay | Admitting: Family Medicine

## 2017-10-29 ENCOUNTER — Other Ambulatory Visit: Payer: Self-pay | Admitting: Family Medicine

## 2017-10-30 ENCOUNTER — Other Ambulatory Visit: Payer: Self-pay | Admitting: Family Medicine

## 2017-10-30 NOTE — Telephone Encounter (Signed)
Called pt and left a VM to call back to find out if he is taking this or not.

## 2017-10-30 NOTE — Telephone Encounter (Signed)
Call the pt find out if they are taking this medication or not.

## 2017-10-31 NOTE — Telephone Encounter (Signed)
Called pt and left a VM to call back.  

## 2017-10-31 NOTE — Telephone Encounter (Signed)
I spoke with pt and Lisinopril-HCTZ has been discontinue, advised pt to ask pharmacy to remove from his current medication list.

## 2017-11-24 ENCOUNTER — Telehealth: Payer: Self-pay

## 2017-11-24 MED ORDER — AMLODIPINE BESYLATE 5 MG PO TABS: 5.0000 mg | ORAL_TABLET | Freq: Every day | ORAL | 2 refills | Status: DC

## 2017-11-24 NOTE — Telephone Encounter (Signed)
Refill requested faxed for amlodipine tab   Last OV 06/23/2017.  Rx was last refilled 10/30/2017 disp 30 with 3 refills.  Rx sent.

## 2018-04-04 ENCOUNTER — Telehealth: Payer: Self-pay | Admitting: Family Medicine

## 2018-04-04 MED ORDER — METHYLPREDNISOLONE 4 MG PO TBPK: ORAL_TABLET | ORAL | 0 refills | Status: DC

## 2018-04-04 NOTE — Telephone Encounter (Signed)
Copied from [place] [id number]. Topic: Quick Communication - See Telephone Encounter >> Apr 04, 2018  8:33 AM Aurelio Brash B wrote: CRM for notification. See Telephone encounter for: 04/04/18.  Pt states the pharmacy told him he should talk to office about getting prednisone for poison oak  on neck wrist back  waist  pretty much all over his body.   [facility] [place] [place] [phone] (Phone) [phone] (Fax)

## 2018-04-04 NOTE — Telephone Encounter (Signed)
[facility]. Called pt and left a VM that medication has been sent in.

## 2018-04-04 NOTE — Telephone Encounter (Signed)
Sent to PCP to advise 

## 2018-04-04 NOTE — Telephone Encounter (Signed)
Call in a Medrol dose pack  

## 2018-06-06 ENCOUNTER — Telehealth: Payer: Self-pay | Admitting: Family Medicine

## 2018-06-06 NOTE — Telephone Encounter (Signed)
Copied from [place] [id number]. Topic: General - Other >> Jun 06, 2018 10:52 AM Synthia Innocent wrote: Reason for CRM: Patient is requesting list of counselors that Dr Sarajane Jews recommends. Wanted to let provider know that he is going to decline to see neurologist.

## 2018-06-07 NOTE — Telephone Encounter (Signed)
Please advise 

## 2018-06-07 NOTE — Telephone Encounter (Signed)
Have him make an OV to discuss this. I have not seen him in a year and I have no idea what he is talking about

## 2018-06-07 NOTE — Telephone Encounter (Signed)
Patient is calling to follow up on this.  °

## 2018-06-08 NOTE — Telephone Encounter (Signed)
I left a voice message for pt, advised we no longer draw labs before the office visit for a physical.

## 2018-06-08 NOTE — Telephone Encounter (Signed)
Called pt and left a detailed VM and advised for pt to call us back to set up for an OV.

## 2018-06-08 NOTE — Telephone Encounter (Signed)
Patient is calling and states he has a physical scheduled on 06/28/18 and wants to know if he needs to come earlier.

## 2018-06-28 ENCOUNTER — Ambulatory Visit (INDEPENDENT_AMBULATORY_CARE_PROVIDER_SITE_OTHER): Payer: BLUE CROSS/BLUE SHIELD | Admitting: Family Medicine

## 2018-06-28 ENCOUNTER — Encounter: Payer: Self-pay | Admitting: Family Medicine

## 2018-06-28 VITALS — BP 142/90 | HR 72 | Temp 98.1°F | Wt 198.2 lb

## 2018-06-28 DIAGNOSIS — Z0000 Encounter for general adult medical examination without abnormal findings: Secondary | ICD-10-CM

## 2018-06-28 LAB — POC URINALSYSI DIPSTICK (AUTOMATED)
Bilirubin, UA: NEGATIVE
Blood, UA: NEGATIVE
Glucose, UA: NEGATIVE
Ketones, UA: NEGATIVE
Leukocytes, UA: NEGATIVE
Nitrite, UA: NEGATIVE
Protein, UA: POSITIVE — AB
Spec Grav, UA: 1.02 (ref 1.010–1.025)
Urobilinogen, UA: 0.2 E.U./dL
pH, UA: 6.5 (ref 5.0–8.0)

## 2018-06-28 LAB — LIPID PANEL
Cholesterol: 166 mg/dL (ref 0–200)
HDL: 47.8 mg/dL (ref >39.00)
LDL Cholesterol: 88 mg/dL (ref 0–99)
NonHDL: 118.28
Total CHOL/HDL Ratio: 3
Triglycerides: 152 mg/dL — ABNORMAL HIGH (ref 0.0–149.0)
VLDL: 30.4 mg/dL (ref 0.0–40.0)

## 2018-06-28 LAB — CBC WITH DIFFERENTIAL/PLATELET
Basophils Absolute: 0 10*3/uL (ref 0.0–0.1)
Basophils Relative: 0.8 % (ref 0.0–3.0)
Eosinophils Absolute: 0.1 10*3/uL (ref 0.0–0.7)
Eosinophils Relative: 3.5 % (ref 0.0–5.0)
HCT: 45.8 % (ref 39.0–52.0)
Hemoglobin: 16 g/dL (ref 13.0–17.0)
Lymphocytes Relative: 25.3 % (ref 12.0–46.0)
Lymphs Abs: 1.1 10*3/uL (ref 0.7–4.0)
MCHC: 35 g/dL (ref 30.0–36.0)
MCV: 96 fl (ref 78.0–100.0)
Monocytes Absolute: 0.4 10*3/uL (ref 0.1–1.0)
Monocytes Relative: 9.4 % (ref 3.0–12.0)
Neutro Abs: 2.6 10*3/uL (ref 1.4–7.7)
Neutrophils Relative %: 61 % (ref 43.0–77.0)
Platelets: 207 10*3/uL (ref 150.0–400.0)
RBC: 4.77 Mil/uL (ref 4.22–5.81)
RDW: 12.7 % (ref 11.5–15.5)
WBC: 4.3 10*3/uL (ref 4.0–10.5)

## 2018-06-28 LAB — BASIC METABOLIC PANEL
BUN: 14 mg/dL (ref 6–23)
CO2: 32 mEq/L (ref 19–32)
Calcium: 10 mg/dL (ref 8.4–10.5)
Chloride: 103 mEq/L (ref 96–112)
Creatinine, Ser: 1.04 mg/dL (ref 0.40–1.50)
GFR: 78.9 mL/min (ref >60.00)
Glucose, Bld: 96 mg/dL (ref 70–99)
Potassium: 4.9 mEq/L (ref 3.5–5.1)
Sodium: 140 mEq/L (ref 135–145)

## 2018-06-28 LAB — HEPATIC FUNCTION PANEL
ALT: 33 U/L (ref 0–53)
AST: 25 U/L (ref 0–37)
Albumin: 4.7 g/dL (ref 3.5–5.2)
Alkaline Phosphatase: 56 U/L (ref 39–117)
Bilirubin, Direct: 0.2 mg/dL (ref 0.0–0.3)
Total Bilirubin: 0.9 mg/dL (ref 0.2–1.2)
Total Protein: 6.9 g/dL (ref 6.0–8.3)

## 2018-06-28 LAB — TSH: TSH: 0.68 u[IU]/mL (ref 0.35–4.50)

## 2018-06-28 LAB — PSA: PSA: 0.43 ng/mL (ref 0.10–4.00)

## 2018-06-28 MED ORDER — ATORVASTATIN CALCIUM 20 MG PO TABS: 20.0000 mg | ORAL_TABLET | Freq: Every day | ORAL | 3 refills | Status: DC

## 2018-06-28 MED ORDER — AMLODIPINE BESYLATE 5 MG PO TABS: 5.0000 mg | ORAL_TABLET | Freq: Every day | ORAL | 3 refills | Status: DC

## 2018-06-28 NOTE — Progress Notes (Signed)
   Subjective:    Patient ID: Todd Reid, male    DOB: Jul 10, 1961, 57 y.o.   MRN: [id number]  HPI Here for a well exam. He has a few issues to discuss. First he has been struggling a bit with finding a new direction in his life. His wife passed away 7 years ago and he has not been very successful on the dating scene. He is interested in talking to a counselor. Also he occasionally has leakage of a small amount of liquid stool with no warnings. Most of his BMs are formed and what he considers to be regular. No pain or bloating.    Review of Systems  Constitutional: Negative.   HENT: Negative.   Eyes: Negative.   Respiratory: Negative.   Cardiovascular: Negative.   Gastrointestinal: Negative.   Genitourinary: Negative.   Musculoskeletal: Negative.   Skin: Negative.   Neurological: Negative.   Psychiatric/Behavioral: Negative.        Objective:   Physical Exam  Constitutional: He is oriented to person, place, and time. He appears well-developed and well-nourished. No distress.  HENT:  Head: Normocephalic and atraumatic.  Right Ear: External ear normal.  Left Ear: External ear normal.  Nose: Nose normal.  Mouth/Throat: Oropharynx is clear and moist. No oropharyngeal exudate.  Eyes: Pupils are equal, round, and reactive to light. Conjunctivae and EOM are normal. Right eye exhibits no discharge. Left eye exhibits no discharge. No scleral icterus.  Neck: Neck supple. No JVD present. No tracheal deviation present. No thyromegaly present.  Cardiovascular: Normal rate, regular rhythm, normal heart sounds and intact distal pulses. Exam reveals no gallop and no friction rub.  No murmur heard. Pulmonary/Chest: Effort normal and breath sounds normal. No respiratory distress. He has no wheezes. He has no rales. He exhibits no tenderness.  Abdominal: Soft. Bowel sounds are normal. He exhibits no distension and no mass. There is no tenderness. There is no rebound and no guarding.    Genitourinary: Rectum normal, prostate normal and penis normal. Rectal exam shows guaiac negative stool. No penile tenderness.  Musculoskeletal: Normal range of motion. He exhibits no edema or tenderness.  Lymphadenopathy:    He has no cervical adenopathy.  Neurological: He is alert and oriented to person, place, and time. He has normal reflexes. He displays normal reflexes. No cranial nerve deficit. He exhibits normal muscle tone. Coordination normal.  Skin: Skin is warm and dry. No rash noted. He is not diaphoretic. No erythema. No pallor.  Psychiatric: He has a normal mood and affect. His behavior is normal. Judgment and thought content normal.          Assessment & Plan:  Well exam. We discussed diet and exercise. Get fasting labs. I gave him information about several therapy groups in town he can talk to. I also suggested he try Miralax daily to modulate the stools.  Alysia Penna, MD

## 2018-07-18 DIAGNOSIS — L570 Actinic keratosis: Secondary | ICD-10-CM | POA: Diagnosis not present

## 2018-07-18 DIAGNOSIS — Z85828 Personal history of other malignant neoplasm of skin: Secondary | ICD-10-CM | POA: Diagnosis not present

## 2018-07-18 DIAGNOSIS — D1801 Hemangioma of skin and subcutaneous tissue: Secondary | ICD-10-CM | POA: Diagnosis not present

## 2018-07-30 DIAGNOSIS — Z008 Encounter for other general examination: Secondary | ICD-10-CM | POA: Diagnosis not present

## 2018-07-30 DIAGNOSIS — I10 Essential (primary) hypertension: Secondary | ICD-10-CM | POA: Diagnosis not present

## 2018-08-13 DIAGNOSIS — R269 Unspecified abnormalities of gait and mobility: Secondary | ICD-10-CM | POA: Diagnosis not present

## 2018-08-13 DIAGNOSIS — R42 Dizziness and giddiness: Secondary | ICD-10-CM | POA: Diagnosis not present

## 2018-08-21 ENCOUNTER — Encounter: Payer: Self-pay | Admitting: Neurology

## 2018-08-21 ENCOUNTER — Ambulatory Visit (INDEPENDENT_AMBULATORY_CARE_PROVIDER_SITE_OTHER): Payer: BLUE CROSS/BLUE SHIELD | Admitting: Neurology

## 2018-08-21 VITALS — BP 140/92 | HR 58 | Ht 70.0 in | Wt 203.5 lb

## 2018-08-21 DIAGNOSIS — R42 Dizziness and giddiness: Secondary | ICD-10-CM

## 2018-08-21 DIAGNOSIS — G959 Disease of spinal cord, unspecified: Secondary | ICD-10-CM | POA: Insufficient documentation

## 2018-08-21 DIAGNOSIS — G950 Syringomyelia and syringobulbia: Secondary | ICD-10-CM

## 2018-08-21 DIAGNOSIS — H532 Diplopia: Secondary | ICD-10-CM | POA: Diagnosis not present

## 2018-08-21 DIAGNOSIS — H814 Vertigo of central origin: Secondary | ICD-10-CM

## 2018-08-21 HISTORY — DX: Disease of spinal cord, unspecified: G95.9

## 2018-08-21 HISTORY — DX: Syringomyelia and syringobulbia: G95.0
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
## 2018-08-23 ENCOUNTER — Telehealth: Payer: Self-pay | Admitting: Neurology

## 2018-08-23 LAB — TSH: TSH: 0.926 u[IU]/mL (ref 0.450–4.500)

## 2018-08-23 LAB — SEDIMENTATION RATE: Sed Rate: 3 mm/hr (ref 0–30)

## 2018-08-23 LAB — HIV ANTIBODY (ROUTINE TESTING W REFLEX): HIV Screen 4th Generation wRfx: NONREACTIVE

## 2018-08-23 LAB — ANGIOTENSIN CONVERTING ENZYME: Angio Convert Enzyme: 27 U/L (ref 14–82)

## 2018-08-23 LAB — RPR: RPR Ser Ql: NONREACTIVE

## 2018-08-23 LAB — VITAMIN B12: Vitamin B-12: 490 pg/mL (ref 232–1245)

## 2018-08-23 LAB — RHEUMATOID FACTOR: Rhuematoid fact SerPl-aCnc: <10 IU/mL (ref 0.0–13.9)

## 2018-08-23 LAB — B. BURGDORFI ANTIBODIES: Lyme IgG/IgM Ab: <0.91 {ISR} (ref 0.00–0.90)

## 2018-08-23 LAB — ACETYLCHOLINE RECEPTOR, BINDING: AChR Binding Ab, Serum: 0.05 nmol/L (ref 0.00–0.24)

## 2018-08-23 LAB — ANA W/REFLEX: Anti Nuclear Antibody(ANA): NEGATIVE

## 2018-08-23 NOTE — Telephone Encounter (Signed)
lvm for pt to call back about scheduling mri  [employer] Auth: [id number] (exp. 08/23/18 to 09/21/18)

## 2018-08-27 ENCOUNTER — Telehealth: Payer: Self-pay

## 2018-08-27 DIAGNOSIS — Z23 Encounter for immunization: Secondary | ICD-10-CM | POA: Diagnosis not present

## 2018-08-27 NOTE — Telephone Encounter (Signed)
I called and left a detailed message with normal lab results on patient's cell, ok per dpr. I advised patient to call back with any questions or concerns.

## 2018-08-27 NOTE — Telephone Encounter (Signed)
Notes recorded by Kathrynn Ducking, MD on 08/23/2018 at 5:13 PM EDT The blood work results are unremarkable.

## 2018-08-30 DIAGNOSIS — H5203 Hypermetropia, bilateral: Secondary | ICD-10-CM | POA: Diagnosis not present

## 2018-09-26 ENCOUNTER — Ambulatory Visit (INDEPENDENT_AMBULATORY_CARE_PROVIDER_SITE_OTHER): Payer: BLUE CROSS/BLUE SHIELD | Admitting: Podiatry

## 2018-09-26 ENCOUNTER — Ambulatory Visit (INDEPENDENT_AMBULATORY_CARE_PROVIDER_SITE_OTHER): Payer: BLUE CROSS/BLUE SHIELD

## 2018-09-26 ENCOUNTER — Encounter: Payer: Self-pay | Admitting: Podiatry

## 2018-09-26 VITALS — BP 136/90 | HR 57 | Resp 16

## 2018-09-26 DIAGNOSIS — M205X9 Other deformities of toe(s) (acquired), unspecified foot: Secondary | ICD-10-CM

## 2018-09-26 DIAGNOSIS — M2011 Hallux valgus (acquired), right foot: Secondary | ICD-10-CM

## 2018-09-26 DIAGNOSIS — M2012 Hallux valgus (acquired), left foot: Secondary | ICD-10-CM | POA: Diagnosis not present

## 2018-09-26 DIAGNOSIS — M779 Enthesopathy, unspecified: Secondary | ICD-10-CM | POA: Diagnosis not present

## 2018-09-26 MED ORDER — TRIAMCINOLONE ACETONIDE 10 MG/ML IJ SUSP
10.0000 mg | Freq: Once | INTRAMUSCULAR | Status: AC
  Administered 2018-09-26: 10 mg

## 2018-09-26 NOTE — Progress Notes (Signed)
   Subjective:    Patient ID: Todd Reid, male    DOB: 05/19/1961, 57 y.o.   MRN: [id number]  HPI    Review of Systems  All other systems reviewed and are negative.      Objective:   Physical Exam        Assessment & Plan:

## 2018-09-26 NOTE — Patient Instructions (Signed)
Hallux Rigidus Hallux rigidus is a type of joint pain or joint disease (arthritis) that affects your big toe (hallux). This condition involves the joint that connects the base of your big toe to the main part of your foot (metatarsophalangeal joint). This condition can cause your big toe to become stiff, painful, and difficult to move. Symptoms may get worse with movement or in cold or damp weather. The condition also gets worse over time. What are the causes? This condition may be caused by having a foot that does not function the way that it should or has an abnormal shape (structural deformity). These foot problems can run in families (be hereditary). This condition can also be caused by:  Injury.  Overuse.  Certain inflammatory diseases, including gout and rheumatoid arthritis.  What increases the risk? This condition is more likely to develop in people who:  Have a foot bone (metatarsal) that is longer or higher than normal.  Have a family history of hallux rigidus.  Have previously injured their big toe.  Have feet that do not have a curve (arch) on the inner side of the foot. This may be called flat feet or fallen arches.  Turn their ankles in when they walk (pronation).  Have rheumatoid arthritis or gout.  Have to stoop down often at work.  What are the signs or symptoms? Symptoms of this condition include:  Big toe pain.  Stiffness and difficulty moving the big toe.  Swelling of the toe and surrounding area.  Bone spurs. These are bony growths that can form on the joint of the big toe.  A limp.  How is this diagnosed? This condition is diagnosed based on a medical history and physical exam. This may include X-rays. How is this treated? Treatment for this condition includes:  Wearing roomy, comfortable shoes that have a large toe box.  Putting orthotic devices in your shoes.  Pain medicines.  Physical therapy.  Icing the injured area.  Alternate  between putting your foot in cold water then warm water.  If your condition is severe, treatment may include:  Corticosteroid injections to relieve pain.  Surgery to remove bone spurs, fuse damaged bones together, or replace the entire joint.  Follow these instructions at home:  Take over-the-counter and prescription medicines only as told by your health care provider.  Do not wear high heels or other restrictive footwear. Wear comfortable, supportive shoes that have a large toe box.  Wear orthotics as told by your health care provider, if this applies.  Put your feet in cold water for 30 seconds, then in warm water for 30 seconds. Alternate between the cold and warm water for 5 minutes. Do this several times a day or as told by your health care provider.  If directed, apply ice to the injured area. ? Put ice in a plastic bag. ? Place a towel between your skin and the bag. ? Leave the ice on for 20 minutes, 2-3 times per day.  Do foot exercises as instructed by your health care provider or a physical therapist.  Keep all follow-up visits as told by your health care provider. This is important. Contact a health care provider if:  You notice bone spurs or growths on or around your big toe.  Your pain does not get better or it gets worse.  You have pain while resting.  You have pain in other parts of your body, such as your back, hip, or knee.  You start to limp.   This information is not intended to replace advice given to you by your health care provider. Make sure you discuss any questions you have with your health care provider. Document Released: 10/31/2005 Document Revised: 04/07/2016 Document Reviewed: 07/08/2015 Elsevier Interactive Patient Education  2018 Elsevier Inc.  

## 2018-09-26 NOTE — Progress Notes (Signed)
Subjective:   Patient ID: Todd Reid, male   DOB: 57 y.o.   MRN: [id number]   HPI Patient presents stating of surgery on his right big toe joint 23 years ago it is become very bothersome recently and has bothered him somewhat for a long time.  Patient states he does not have very much motion and is also lost some of the motion of the big toe joint left but does not have significant discomfort on the left.  She does not smoke and would like to be more active   Review of Systems  All other systems reviewed and are negative.       Objective:  Physical Exam  Constitutional: He appears well-developed and well-nourished.  Cardiovascular: Intact distal pulses.  Pulmonary/Chest: Effort normal.  Musculoskeletal: Normal range of motion.  Neurological: He is alert.  Skin: Skin is warm.  Nursing note and vitals reviewed.   Neurovascular status intact muscle strength is adequate range of motion within normal limits subtalar midtarsal joint with significant range of motion loss first MPJ right with pain around the joint surface both lateral and dorsal.  Patient on the left has mild diminishment of range of motion with no crepitus noted bilateral.  Patient is found to have good digital perfusion is well oriented x3     Assessment:  Hallux limitus rigidus condition right over left with inflammatory capsulitis of the joint surface right of relative short duration     Plan:  H&P education and x-rays reviewed with patient.  At this point I have recommended orthotics with probable either kinetic wedge or Morton's extension and I am referring to ped orthotist for this determination.  I did go ahead for the right one and I injected the right first MPJ 3 mg Kenalog 5 mg Xylocaine to reduce the inflammation of the joint and I did discuss ultimately this may require surgical intervention.  Patient at this point will be seen back for orthotic casting and I will see back depending on response to  injection of the joint and understands ultimately he may require surgery to shorten and plantarflex the first metatarsal right foot  X-ray indicates there is elongation of the first metatarsal bilateral with dorsal spurring occurring bilateral and history of surgery on the right first metatarsal

## 2018-10-02 ENCOUNTER — Ambulatory Visit: Payer: BLUE CROSS/BLUE SHIELD | Admitting: Orthotics

## 2018-10-02 DIAGNOSIS — M2011 Hallux valgus (acquired), right foot: Secondary | ICD-10-CM

## 2018-10-02 DIAGNOSIS — M2012 Hallux valgus (acquired), left foot: Principal | ICD-10-CM

## 2018-10-02 DIAGNOSIS — M779 Enthesopathy, unspecified: Secondary | ICD-10-CM | POA: Diagnosis not present

## 2018-10-02 DIAGNOSIS — M205X9 Other deformities of toe(s) (acquired), unspecified foot: Secondary | ICD-10-CM
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
## 2018-10-21 NOTE — Progress Notes (Signed)
CMFO per dr. Paulla Dolly to address hallux rigidus R>L.  Plan on carboplast cmfo w/ morton extrension R.   Richy to fab

## 2018-10-22 ENCOUNTER — Ambulatory Visit (INDEPENDENT_AMBULATORY_CARE_PROVIDER_SITE_OTHER): Payer: BLUE CROSS/BLUE SHIELD | Admitting: Orthotics

## 2018-10-22 DIAGNOSIS — M205X9 Other deformities of toe(s) (acquired), unspecified foot: Secondary | ICD-10-CM

## 2018-10-22 DIAGNOSIS — M2011 Hallux valgus (acquired), right foot: Secondary | ICD-10-CM

## 2018-10-22 DIAGNOSIS — M2012 Hallux valgus (acquired), left foot: Principal | ICD-10-CM

## 2018-10-22 NOTE — Progress Notes (Signed)
Patient came in today to pick up custom made foot orthotics.  The goals were accomplished and the patient reported no dissatisfaction with said orthotics.  Patient was advised of breakin period and how to report any issues. 

## 2018-11-20 ENCOUNTER — Encounter: Payer: Self-pay | Admitting: Family Medicine

## 2018-11-20 ENCOUNTER — Ambulatory Visit (INDEPENDENT_AMBULATORY_CARE_PROVIDER_SITE_OTHER): Payer: BLUE CROSS/BLUE SHIELD | Admitting: Family Medicine

## 2018-11-20 VITALS — BP 130/82 | HR 71 | Temp 97.6°F | Wt 206.0 lb

## 2018-11-20 DIAGNOSIS — M67442 Ganglion, left hand: Secondary | ICD-10-CM | POA: Diagnosis not present

## 2018-11-20 NOTE — Progress Notes (Signed)
   Subjective:    Patient ID: Todd Reid, male    DOB: 01/09/1961, 58 y.o.   MRN: [id number]  HPI Here for 3 weeks of of a large lump on the left hand. It is not painful. No recent trauma.    Review of Systems  Constitutional: Negative.   Respiratory: Negative.   Cardiovascular: Negative.   Musculoskeletal: Positive for joint swelling.       Objective:   Physical Exam Constitutional:      Appearance: Normal appearance.  Cardiovascular:     Rate and Rhythm: Normal rate and regular rhythm.     Pulses: Normal pulses.     Heart sounds: Normal heart sounds.  Pulmonary:     Effort: Pulmonary effort is normal.     Breath sounds: Normal breath sounds.  Musculoskeletal:     Comments: The left dorsal hand has a firm, mobile, round, non-tender lump in the anatomical snuffbox  Neurological:     Mental Status: He is alert.           Assessment & Plan:  Ganglion cyst. Refer to Hand Surgery to remove.  Alysia Penna, MD

## 2018-12-04 DIAGNOSIS — M7989 Other specified soft tissue disorders: Secondary | ICD-10-CM | POA: Diagnosis not present

## 2018-12-21 ENCOUNTER — Other Ambulatory Visit: Payer: Self-pay | Admitting: Family Medicine

## 2019-01-11 ENCOUNTER — Other Ambulatory Visit: Payer: Self-pay | Admitting: Family Medicine

## 2019-04-22 ENCOUNTER — Other Ambulatory Visit: Payer: Self-pay | Admitting: Family Medicine

## 2019-05-04 ENCOUNTER — Other Ambulatory Visit: Payer: Self-pay | Admitting: Family Medicine

## 2019-08-05 ENCOUNTER — Ambulatory Visit: Payer: BLUE CROSS/BLUE SHIELD

## 2019-08-09 ENCOUNTER — Encounter: Payer: Self-pay | Admitting: Family Medicine

## 2019-08-09 ENCOUNTER — Ambulatory Visit (INDEPENDENT_AMBULATORY_CARE_PROVIDER_SITE_OTHER): Payer: Self-pay

## 2019-08-09 ENCOUNTER — Other Ambulatory Visit: Payer: Self-pay

## 2019-08-09 DIAGNOSIS — Z23 Encounter for immunization: Secondary | ICD-10-CM

## 2019-08-27 ENCOUNTER — Ambulatory Visit: Payer: Self-pay | Admitting: Neurology

## 2019-08-27 ENCOUNTER — Ambulatory Visit: Payer: BLUE CROSS/BLUE SHIELD | Admitting: Neurology

## 2019-09-24 ENCOUNTER — Other Ambulatory Visit: Payer: Self-pay

## 2019-09-24 ENCOUNTER — Ambulatory Visit (INDEPENDENT_AMBULATORY_CARE_PROVIDER_SITE_OTHER): Payer: 59

## 2019-09-24 ENCOUNTER — Ambulatory Visit (INDEPENDENT_AMBULATORY_CARE_PROVIDER_SITE_OTHER): Payer: 59 | Admitting: Orthopaedic Surgery

## 2019-09-24 ENCOUNTER — Encounter: Payer: Self-pay | Admitting: Orthopaedic Surgery

## 2019-09-24 VITALS — BP 150/95 | HR 56 | Ht 70.0 in | Wt 192.0 lb

## 2019-09-24 DIAGNOSIS — M25511 Pain in right shoulder: Secondary | ICD-10-CM

## 2019-09-24 DIAGNOSIS — M7541 Impingement syndrome of right shoulder: Secondary | ICD-10-CM | POA: Insufficient documentation

## 2019-09-24 MED ORDER — LIDOCAINE HCL 1 % IJ SOLN
0.5000 mL | INTRAMUSCULAR | Status: AC | PRN
  Administered 2019-09-24: .5 mL

## 2019-09-24 MED ORDER — METHYLPREDNISOLONE ACETATE 40 MG/ML IJ SUSP
40.0000 mg | INTRAMUSCULAR | Status: AC | PRN
  Administered 2019-09-24: 40 mg via INTRA_ARTICULAR

## 2019-09-24 MED ORDER — BUPIVACAINE HCL 0.25 % IJ SOLN
4.0000 mL | INTRAMUSCULAR | Status: AC | PRN
  Administered 2019-09-24: 14:00:00 4 mL via INTRA_ARTICULAR

## 2019-09-24 NOTE — Progress Notes (Signed)
Office Visit Note   Patient: Todd Reid           Date of Birth: 02/15/1961           MRN: [id number] Visit Date: 09/24/2019              Requested by: Nelwyn Salisbury, MD [address] [place],  [place] [address] PCP: Nelwyn Salisbury, MD   Assessment & Plan: Visit Diagnoses:  1. Acute pain of right shoulder   2. Impingement syndrome of right shoulder     Plan: Subacromial injection performed which she tolerated well.  If he has persistent problems he will call let us know we can consider diagnostic imaging.  We discussed some activity modification.  Follow-Up Instructions: No follow-ups on file.   Orders:  Orders Placed This Encounter  Procedures  . Large Joint Inj  . XR Shoulder Right   No orders of the defined types were placed in this encounter.     Procedures: Large Joint Inj: R subacromial bursa on 09/24/2019 1:36 PM Indications: pain Details: 22 G 1.5 in needle  Arthrogram: No  Medications: 4 mL bupivacaine 0.25 %; 40 mg methylPREDNISolone acetate 40 MG/ML; 0.5 mL lidocaine 1 % Outcome: tolerated well, no immediate complications Procedure, treatment alternatives, risks and benefits explained, specific risks discussed. Consent was given by the patient. Immediately prior to procedure a time out was called to verify the correct patient, procedure, equipment, support staff and site/side marked as required. Patient was prepped and draped in the usual sterile fashion.       Clinical Data: No additional findings.   Subjective: Chief Complaint  Patient presents with  . Right Shoulder - Pain    HPI 58 year old male seen with right shoulder pain with outstretch activity overhead activity.  Previous superior labral tear with open biceps tenodesis done in 2007 which was 13 years ago.  He is done well until recently states the last few weeks has been more painful he actually played tennis within the last 48 hours and overhead activities such as  serving bothered him more.  He is not noticed any migration of the biceps muscle.  No numbness or tingling in his hand.  Previous disc arthroplasty by Dr. Marikay Alar at [facility] has done well.  Review of Systems no fever chills no bowel bladder symptoms no myelopathic changes.  No opposite left shoulder symptoms.  He is systems otherwise unchanged as it pertains to HPI 14 point systems update.    Objective: Vital Signs: BP (!) 150/95   Pulse (!) 56   Ht 5\' 10"  (1.778 m)   Wt 192 lb (87.1 kg)   BMI 27.55 kg/m   Physical Exam Constitutional:      Appearance: He is well-developed.  HENT:     Head: Normocephalic and atraumatic.  Eyes:     Pupils: Pupils are equal, round, and reactive to light.  Neck:     Thyroid: No thyromegaly.     Trachea: No tracheal deviation.  Cardiovascular:     Rate and Rhythm: Normal rate.  Pulmonary:     Effort: Pulmonary effort is normal.     Breath sounds: No wheezing.  Abdominal:     General: Bowel sounds are normal.     Palpations: Abdomen is soft.  Skin:    General: Skin is warm and dry.     Capillary Refill: Capillary refill takes less than 2 seconds.  Neurological:     Mental Status: He is  alert and oriented to person, place, and time.  Psychiatric:        Behavior: Behavior normal.        Thought Content: Thought content normal.        Judgment: Judgment normal.     Ortho Exam patient has positive impingement right shoulder.  No tenderness over the tenodesis site.  No distal migration of the biceps elbow has full range of motion well-healed right side cervical incision.  Upper semireflexes are 2+ and symmetrical normal sensory the upper extremities.  Normal heel toe gait.  Specialty Comments:  No specialty comments available.  Imaging: Xr Shoulder Right  Result Date: 09/24/2019 2 view x-rays right shoulder obtained as well as axillary view.  This shows previous changes from biceps tenodesis.  No high riding of the head no glenohumeral  arthritis.  Negative for acute changes. Impression: Post biceps tenodesis.  Negative for acute changes.    PMFS History: Patient Active Problem List   Diagnosis Date Noted  . Impingement syndrome of right shoulder 09/24/2019  . Cervical myelopathy (HCC) 08/21/2018  . Syrinx of spinal cord (HCC) 08/21/2018  . Chronic neck pain 04/30/2015  . Peyronie's syndrome 04/30/2015  . Hyperlipidemia 01/26/2015  . HTN (hypertension) 05/04/2012  . HEMORRHOIDS, EXTERNAL 08/10/2010  . GROIN STRAIN 06/29/2010  . THRUSH 01/22/2009  . CARDIAC MURMUR, HX OF 01/22/2009   Past Medical History:  Diagnosis Date  . Arthritis   . Bulging of cervical intervertebral disc   . Cervical myelopathy (HCC) 08/21/2018  . Chicken pox   . Hyperlipidemia   . Hypertension   . Injury, nerve, musculocutaneous    Pinched nerve right side of neck.  . Syrinx of spinal cord (HCC) 08/21/2018   Cervical    Family History  Problem Relation Age of Onset  . Alcohol abuse Unknown   . Colon cancer Neg Hx     Past Surgical History:  Procedure Laterality Date  . ARTHROSCOPIC REPAIR ACL    . bone spurs     removed from right foot  . bunions     removed from right foot  . COLONOSCOPY  01/13/2017   per Dr. Adela Lank, sessile serrated polyps, repeat in 3 yrs   . EYE SURGERY     left eye repair  . KNEE SURGERY     reconstruction  . SHOULDER ARTHROSCOPY W/ LABRAL REPAIR     Social History   Occupational History  . Not on file  Tobacco Use  . Smoking status: Never Smoker  . Smokeless tobacco: Never Used  Substance and Sexual Activity  . Alcohol use: Yes    Alcohol/week: 0.0 standard drinks    Comment: 4-5 nights per week  . Drug use: No  . Sexual activity: Not on file

## 2019-12-11 ENCOUNTER — Other Ambulatory Visit: Payer: Self-pay | Admitting: Family Medicine

## 2019-12-27 ENCOUNTER — Telehealth: Payer: Self-pay | Admitting: Family Medicine

## 2019-12-27 NOTE — Telephone Encounter (Signed)
It is much cheaper to get it at the pharmacy

## 2019-12-27 NOTE — Telephone Encounter (Signed)
Pt is due for second shingrix vaccine and would like to know if he should go ahead and have it done at [facility] or can he wait until physical on 3/12.   Patient Phone: [phone]

## 2020-01-23 ENCOUNTER — Other Ambulatory Visit: Payer: Self-pay

## 2020-01-24 ENCOUNTER — Ambulatory Visit (INDEPENDENT_AMBULATORY_CARE_PROVIDER_SITE_OTHER): Payer: 59 | Admitting: Family Medicine

## 2020-01-24 ENCOUNTER — Encounter: Payer: Self-pay | Admitting: Gastroenterology

## 2020-01-24 ENCOUNTER — Encounter: Payer: Self-pay | Admitting: Family Medicine

## 2020-01-24 VITALS — BP 140/88 | HR 71 | Temp 97.7°F | Ht 70.0 in | Wt 194.4 lb

## 2020-01-24 DIAGNOSIS — Z0000 Encounter for general adult medical examination without abnormal findings: Secondary | ICD-10-CM | POA: Diagnosis not present

## 2020-01-24 LAB — CBC WITH DIFFERENTIAL/PLATELET
Basophils Absolute: 0 10*3/uL (ref 0.0–0.1)
Basophils Relative: 0.8 % (ref 0.0–3.0)
Eosinophils Absolute: 0.2 10*3/uL (ref 0.0–0.7)
Eosinophils Relative: 4.7 % (ref 0.0–5.0)
HCT: 43.9 % (ref 39.0–52.0)
Hemoglobin: 15.6 g/dL (ref 13.0–17.0)
Lymphocytes Relative: 27.8 % (ref 12.0–46.0)
Lymphs Abs: 1.3 10*3/uL (ref 0.7–4.0)
MCHC: 35.4 g/dL (ref 30.0–36.0)
MCV: 95 fl (ref 78.0–100.0)
Monocytes Absolute: 0.4 10*3/uL (ref 0.1–1.0)
Monocytes Relative: 7.8 % (ref 3.0–12.0)
Neutro Abs: 2.7 10*3/uL (ref 1.4–7.7)
Neutrophils Relative %: 58.9 % (ref 43.0–77.0)
Platelets: 194 10*3/uL (ref 150.0–400.0)
RBC: 4.63 Mil/uL (ref 4.22–5.81)
RDW: 12.4 % (ref 11.5–15.5)
WBC: 4.6 10*3/uL (ref 4.0–10.5)

## 2020-01-24 LAB — BASIC METABOLIC PANEL
BUN: 13 mg/dL (ref 6–23)
CO2: 29 mEq/L (ref 19–32)
Calcium: 9.5 mg/dL (ref 8.4–10.5)
Chloride: 104 mEq/L (ref 96–112)
Creatinine, Ser: 0.81 mg/dL (ref 0.40–1.50)
GFR: 98.48 mL/min (ref >60.00)
Glucose, Bld: 90 mg/dL (ref 70–99)
Potassium: 4.2 mEq/L (ref 3.5–5.1)
Sodium: 139 mEq/L (ref 135–145)

## 2020-01-24 LAB — LIPID PANEL
Cholesterol: 187 mg/dL (ref 0–200)
HDL: 47.3 mg/dL (ref >39.00)
LDL Cholesterol: 111 mg/dL — ABNORMAL HIGH (ref 0–99)
NonHDL: 139.49
Total CHOL/HDL Ratio: 4
Triglycerides: 143 mg/dL (ref 0.0–149.0)
VLDL: 28.6 mg/dL (ref 0.0–40.0)

## 2020-01-24 LAB — HEPATIC FUNCTION PANEL
ALT: 63 U/L — ABNORMAL HIGH (ref 0–53)
AST: 33 U/L (ref 0–37)
Albumin: 4.4 g/dL (ref 3.5–5.2)
Alkaline Phosphatase: 56 U/L (ref 39–117)
Bilirubin, Direct: 0.1 mg/dL (ref 0.0–0.3)
Total Bilirubin: 0.8 mg/dL (ref 0.2–1.2)
Total Protein: 6.9 g/dL (ref 6.0–8.3)

## 2020-01-24 LAB — PSA: PSA: 0.4 ng/mL (ref 0.10–4.00)

## 2020-01-24 LAB — TSH: TSH: 0.68 u[IU]/mL (ref 0.35–4.50)

## 2020-01-24 MED ORDER — SILDENAFIL CITRATE 20 MG PO TABS: 100.0000 mg | ORAL_TABLET | Freq: Every day | ORAL | 11 refills | Status: DC | PRN

## 2020-01-24 MED ORDER — AMLODIPINE BESYLATE 5 MG PO TABS: 5.0000 mg | ORAL_TABLET | Freq: Every day | ORAL | 3 refills | Status: DC

## 2020-01-24 NOTE — Progress Notes (Signed)
   Subjective:    Patient ID: Todd Reid, male    DOB: February 06, 1961, 59 y.o.   MRN: [id number]  HPI Here for a well exam. He feels well. He has lost about 15 lbs from his last physical.    Review of Systems  Constitutional: Negative.   HENT: Negative.   Eyes: Negative.   Respiratory: Negative.   Cardiovascular: Negative.   Gastrointestinal: Negative.   Genitourinary: Negative.   Musculoskeletal: Negative.   Skin: Negative.   Neurological: Negative.   Psychiatric/Behavioral: Negative.        Objective:   Physical Exam Constitutional:      General: He is not in acute distress.    Appearance: He is well-developed. He is not diaphoretic.  HENT:     Head: Normocephalic and atraumatic.     Right Ear: External ear normal.     Left Ear: External ear normal.     Nose: Nose normal.     Mouth/Throat:     Pharynx: No oropharyngeal exudate.  Eyes:     General: No scleral icterus.       Right eye: No discharge.        Left eye: No discharge.     Conjunctiva/sclera: Conjunctivae normal.     Pupils: Pupils are equal, round, and reactive to light.  Neck:     Thyroid: No thyromegaly.     Vascular: No JVD.     Trachea: No tracheal deviation.  Cardiovascular:     Rate and Rhythm: Normal rate and regular rhythm.     Heart sounds: Normal heart sounds. No murmur. No friction rub. No gallop.   Pulmonary:     Effort: Pulmonary effort is normal. No respiratory distress.     Breath sounds: Normal breath sounds. No wheezing or rales.  Chest:     Chest wall: No tenderness.  Abdominal:     General: Bowel sounds are normal. There is no distension.     Palpations: Abdomen is soft. There is no mass.     Tenderness: There is no abdominal tenderness. There is no guarding or rebound.  Genitourinary:    Penis: Normal. No tenderness.      Testes: Normal.     Prostate: Normal.     Rectum: Normal. Guaiac result negative.     Comments: He has several non-inflamed external hemorrhoids    Musculoskeletal:        General: No tenderness. Normal range of motion.     Cervical back: Neck supple.  Lymphadenopathy:     Cervical: No cervical adenopathy.  Skin:    General: Skin is warm and dry.     Coloration: Skin is not pale.     Findings: No erythema or rash.  Neurological:     Mental Status: He is alert and oriented to person, place, and time.     Cranial Nerves: No cranial nerve deficit.     Motor: No abnormal muscle tone.     Coordination: Coordination normal.     Deep Tendon Reflexes: Reflexes are normal and symmetric. Reflexes normal.  Psychiatric:        Behavior: Behavior normal.        Thought Content: Thought content normal.        Judgment: Judgment normal.           Assessment & Plan:  Well exam. We discussed diet and exercise. Get fasting labs.  Alysia Penna, MD

## 2020-03-03 ENCOUNTER — Other Ambulatory Visit: Payer: Self-pay

## 2020-03-03 ENCOUNTER — Ambulatory Visit (AMBULATORY_SURGERY_CENTER): Payer: Self-pay | Admitting: *Deleted

## 2020-03-03 VITALS — Temp 97.3°F | Ht 70.0 in | Wt 196.0 lb

## 2020-03-03 DIAGNOSIS — Z86010 Personal history of colonic polyps: Secondary | ICD-10-CM

## 2020-03-03 MED ORDER — NA SULFATE-K SULFATE-MG SULF 17.5-3.13-1.6 GM/177ML PO SOLN: ORAL | 0 refills | Status: DC

## 2020-03-03 NOTE — Progress Notes (Signed)
Patient is here in-person for PV. Patient denies any allergies to eggs or soy. Patient denies any problems with anesthesia/sedation. Patient denies any oxygen use at home. Patient denies taking any diet/weight loss medications or blood thinners. Patient is not being treated for MRSA or C-diff. EMMI education assisgned to the patient for the procedure, this was explained and instructions given to patient. Patient is aware of our care-partner policy and 0000000 safety protocol. Covid vaccines completed per pt on 02/14/2020.  Prep Prescription coupon given to the patient.

## 2020-03-16 ENCOUNTER — Encounter: Payer: Self-pay | Admitting: Gastroenterology

## 2020-03-16 ENCOUNTER — Ambulatory Visit (AMBULATORY_SURGERY_CENTER): Payer: 59 | Admitting: Gastroenterology

## 2020-03-16 ENCOUNTER — Other Ambulatory Visit: Payer: Self-pay

## 2020-03-16 VITALS — BP 123/81 | HR 55 | Temp 96.9°F | Resp 17 | Ht 70.0 in | Wt 196.0 lb

## 2020-03-16 DIAGNOSIS — Z86010 Personal history of colonic polyps: Secondary | ICD-10-CM

## 2020-03-16 DIAGNOSIS — K621 Rectal polyp: Secondary | ICD-10-CM | POA: Diagnosis not present

## 2020-03-16 DIAGNOSIS — D128 Benign neoplasm of rectum: Secondary | ICD-10-CM

## 2020-03-16 HISTORY — PX: COLONOSCOPY: SHX174

## 2020-03-16 MED ORDER — SODIUM CHLORIDE 0.9 % IV SOLN: 500.0000 mL | Freq: Once | INTRAVENOUS | Status: DC

## 2020-03-16 NOTE — Patient Instructions (Signed)
YOU HAD AN ENDOSCOPIC PROCEDURE TODAY AT [facility]:   Refer to the procedure report that was given to you for any specific questions about what was found during the examination.  If the procedure report does not answer your questions, please call your gastroenterologist to clarify.  If you requested that your care partner not be given the details of your procedure findings, then the procedure report has been included in a sealed envelope for you to review at your convenience later.  YOU SHOULD EXPECT: Some feelings of bloating in the abdomen. Passage of more gas than usual.  Walking can help get rid of the air that was put into your GI tract during the procedure and reduce the bloating. If you had a lower endoscopy (such as a colonoscopy or flexible sigmoidoscopy) you may notice spotting of blood in your stool or on the toilet paper. If you underwent a bowel prep for your procedure, you may not have a normal bowel movement for a few days.  Please Note:  You might notice some irritation and congestion in your nose or some drainage.  This is from the oxygen used during your procedure.  There is no need for concern and it should clear up in a day or so.  SYMPTOMS TO REPORT IMMEDIATELY:   Following lower endoscopy (colonoscopy or flexible sigmoidoscopy):  Excessive amounts of blood in the stool  Significant tenderness or worsening of abdominal pains  Swelling of the abdomen that is new, acute  Fever of 100F or higher   For urgent or emergent issues, a gastroenterologist can be reached at any hour by calling [phone]. Do not use MyChart messaging for urgent concerns.    DIET:  We do recommend a small meal at first, but then you may proceed to your regular diet.  Drink plenty of fluids but you should avoid alcoholic beverages for 24 hours.  ACTIVITY:  You should plan to take it easy for the rest of today and you should NOT DRIVE or use heavy machinery until tomorrow (because  of the sedation medicines used during the test).    MEDICATIONS: Continue present medications.  Please see handouts given to you by your recovery nurse.  FOLLOW UP: Our staff will call the number listed on your records 48-72 hours following your procedure to check on you and address any questions or concerns that you may have regarding the information given to you following your procedure. If we do not reach you, we will leave a message.  We will attempt to reach you two times.  During this call, we will ask if you have developed any symptoms of COVID 19. If you develop any symptoms (ie: fever, flu-like symptoms, shortness of breath, cough etc.) before then, please call [phone].  If you test positive for Covid 19 in the 2 weeks post procedure, please call and report this information to [place].    If any biopsies were taken you will be contacted by phone or by letter within the next 1-3 weeks.  Please call us at [phone] if you have not heard about the biopsies in 3 weeks.   Thank you for allowing [place] to provide for your healthcare needs today.   SIGNATURES/CONFIDENTIALITY: You and/or your care partner have signed paperwork which will be entered into your electronic medical record.  These signatures attest to the fact that that the information above on your After Visit Summary has been reviewed and is understood.  Full responsibility of the  confidentiality of this discharge information lies with you and/or your care-partner. 

## 2020-03-16 NOTE — Progress Notes (Signed)
[place]

## 2020-03-16 NOTE — Progress Notes (Signed)
Pt's states no medical or surgical changes since previsit or office visit. 

## 2020-03-16 NOTE — Op Note (Signed)
[facility] Patient Name: Todd Reid Procedure Date: 03/16/2020 8:36 AM MRN: [id number] Endoscopist: Remo Lipps P. Havery Moros , MD Age: 59 Referring MD:  Date of Birth: May 22, 1961 Gender: Male Account #: [id number] Procedure:                Colonoscopy Indications:              High risk colon cancer surveillance: Personal                            history of colonic polyps (multiple sessile                            serrated polyps removed in 2018), patient                            incidentally endorses symptomatic hemorrhoids Medicines:                Monitored Anesthesia Care Procedure:                Pre-Anesthesia Assessment:                           - Prior to the procedure, a History and Physical                            was performed, and patient medications and                            allergies were reviewed. The patient's tolerance of                            previous anesthesia was also reviewed. The risks                            and benefits of the procedure and the sedation                            options and risks were discussed with the patient.                            All questions were answered, and informed consent                            was obtained. Prior Anticoagulants: The patient has                            taken no previous anticoagulant or antiplatelet                            agents. ASA Grade Assessment: II - A patient with                            mild systemic disease. After reviewing the risks  and benefits, the patient was deemed in                            satisfactory condition to undergo the procedure.                           After obtaining informed consent, the colonoscope                            was passed under direct vision. Throughout the                            procedure, the patient's blood pressure, pulse, and                            oxygen saturations were  monitored continuously. The                            Colonoscope was introduced through the anus and                            advanced to the the cecum, identified by                            appendiceal orifice and ileocecal valve. The                            colonoscopy was performed without difficulty. The                            patient tolerated the procedure well. The quality                            of the bowel preparation was good. The ileocecal                            valve, appendiceal orifice, and rectum were                            photographed. Scope In: 8:38:24 AM Scope Out: 8:54:29 AM Scope Withdrawal Time: 0 hours 12 minutes 45 seconds  Total Procedure Duration: 0 hours 16 minutes 5 seconds  Findings:                 The perianal and digital rectal examinations were                            normal other than hemorrhoids noted.                           Multiple benign appearing flat polyps were found in                            the rectum, grossly consistent with hyperplastic  polyps. The polyps were small in size. Biopsies                            were taken with a cold forceps for histology from a                            representative sample - polyp removed.                           Internal hemorrhoids were found during retroflexion.                           The exam was otherwise without abnormality. Complications:            No immediate complications. Estimated blood loss:                            Minimal. Estimated Blood Loss:     Estimated blood loss was minimal. Impression:               - Multiple small polyps in the rectum, suspect                            benign hyperplastic polyps. Biopsied / removed a                            representative sample.                           - Internal hemorrhoids.                           - The examination was otherwise normal. Recommendation:           -  Patient has a contact number available for                            emergencies. The signs and symptoms of potential                            delayed complications were discussed with the                            patient. Return to normal activities tomorrow.                            Written discharge instructions were provided to the                            patient.                           - Resume previous diet.                           - Continue present medications.                           -  Await pathology results.                           - Consideration for hemorrhoid banding given                            symptoms, will discuss with patient [name]. [name], MD 03/16/2020 8:59:19 AM This report has been signed electronically.

## 2020-03-16 NOTE — Progress Notes (Signed)
Called to room to assist during endoscopic procedure.  Patient ID and intended procedure confirmed with present staff. Received instructions for my participation in the procedure from the performing physician.  

## 2020-03-16 NOTE — Progress Notes (Signed)
Report given to PACU, vss 

## 2020-03-17 ENCOUNTER — Telehealth: Payer: Self-pay

## 2020-03-17 NOTE — Telephone Encounter (Signed)
-----   Message from Yetta Flock, MD sent at 03/16/2020 12:19 PM EDT ----- Regarding: another banding Jan can you help schedule this patient for a routine banding with me?  Thanks

## 2020-03-17 NOTE — Telephone Encounter (Signed)
Called pt. LM for pt to call back to schedule banding appts

## 2020-03-18 ENCOUNTER — Telehealth: Payer: Self-pay

## 2020-03-18 NOTE — Telephone Encounter (Signed)
Mychart message sent to pt asking him to call the office to be scheduled for banding appts with Dr. Havery Moros,

## 2020-03-18 NOTE — Telephone Encounter (Signed)
  Follow up Call-  Call back number 03/16/2020  Post procedure Call Back phone  # [phone]  Permission to leave phone message Yes  Some recent data might be hidden     Patient questions:  Do you have a fever, pain , or abdominal swelling? No. Pain Score  0 *  Have you tolerated food without any problems? Yes.    Have you been able to return to your normal activities? Yes.    Do you have any questions about your discharge instructions: Diet   No. Medications  No. Follow up visit  No.  Do you have questions or concerns about your Care? No.  Actions: * If pain score is 4 or above: No action needed, pain <4.  1. Have you developed a fever since your procedure? NO  2.   Have you had an respiratory symptoms (SOB or cough) since your procedure? NO  3.   Have you tested positive for COVID 19 since your procedure no  4.   Have you had any family members/close contacts diagnosed with the COVID 19 since your procedure?  no   If yes to any of these questions please route to Joylene John, RN and Erenest Rasher, RN

## 2020-03-26 ENCOUNTER — Telehealth: Payer: Self-pay | Admitting: Family Medicine

## 2020-03-26 DIAGNOSIS — M25521 Pain in right elbow: Secondary | ICD-10-CM

## 2020-03-26 DIAGNOSIS — G8929 Other chronic pain: Secondary | ICD-10-CM

## 2020-03-26 NOTE — Telephone Encounter (Signed)
Patient is aware 

## 2020-03-26 NOTE — Telephone Encounter (Signed)
The referral was done  

## 2020-03-26 NOTE — Addendum Note (Signed)
Addended by: Alysia Penna A on: 03/26/2020 12:50 PM   Modules accepted: Orders

## 2020-03-26 NOTE — Telephone Encounter (Signed)
Pt was in the office a month ago for a physical and told Dr. Sarajane Jews, that he right elbow pain. Per Dr. Sarajane Jews, he was to call back if pain worsen. Pt is requesting a referral to an orthopedics. Thanks

## 2020-04-08 ENCOUNTER — Ambulatory Visit (INDEPENDENT_AMBULATORY_CARE_PROVIDER_SITE_OTHER): Payer: 59

## 2020-04-08 ENCOUNTER — Encounter: Payer: Self-pay | Admitting: Orthopaedic Surgery

## 2020-04-08 ENCOUNTER — Ambulatory Visit (INDEPENDENT_AMBULATORY_CARE_PROVIDER_SITE_OTHER): Payer: 59 | Admitting: Orthopaedic Surgery

## 2020-04-08 ENCOUNTER — Other Ambulatory Visit: Payer: Self-pay

## 2020-04-08 VITALS — Ht 70.0 in | Wt 192.0 lb

## 2020-04-08 DIAGNOSIS — M25521 Pain in right elbow: Secondary | ICD-10-CM | POA: Diagnosis not present

## 2020-04-08 DIAGNOSIS — M7711 Lateral epicondylitis, right elbow: Secondary | ICD-10-CM

## 2020-04-08 DIAGNOSIS — M7710 Lateral epicondylitis, unspecified elbow: Secondary | ICD-10-CM | POA: Insufficient documentation

## 2020-04-08 NOTE — Progress Notes (Signed)
Office Visit Note   Patient: Todd Reid           Date of Birth: [birth year]           MRN: [id number] Visit Date: 04/08/2020              Requested by: Laurey Morale, MD [place],  [place] [id number] PCP: Laurey Morale, MD   Assessment & Plan: Visit Diagnoses:  1. Pain in right elbow   2. Lateral epicondylitis of right elbow     Plan: Tennis elbow injection performed.  Tennis elbow brace pathophysiology discussed he will return if he has ongoing problems.  His symptoms are likely exacerbated by the excessive yard work he was doing with pulling gripping squeezing. jTE Brace applied.  Follow-Up Instructions: No follow-ups on file.   Orders:  Orders Placed This Encounter  Procedures  . XR Elbow 2 Views Right   No orders of the defined types were placed in this encounter.     Procedures: Medium Joint Inj: R lateral epicondyle on 04/08/2020 9:12 AM Indications: pain Details: 22 G 1.5 in needle, anterolateral approach Medications: 1 mL bupivacaine 0.5 %; 40 mg methylPREDNISolone acetate 40 MG/ML; 0.5 mL lidocaine 1 % Outcome: tolerated well, no immediate complications Procedure, treatment alternatives, risks and benefits explained, specific risks discussed. Consent was given by the patient. Immediately prior to procedure a time out was called to verify the correct patient, procedure, equipment, support staff and site/side marked as required. Patient was prepped and draped in the usual sterile fashion.       Clinical Data: No additional findings.   Subjective: Chief Complaint  Patient presents with  . Right Elbow - Pain    HPI 59 year old male returns has had increased pain in his elbow pain with gripping and squeezing.  He also has noticed some discomfort in his wrist and points to the snuffbox where his had discomfort.  He is right-hand dominant states has been doing a lot of yard work and has had continued elbow pain particularly with  gripping and pulling for 4 months.  He denies associated neck pain.  Previous subacromial injection right shoulder given good relief of the impingement.  Review of Systems 14 point systems noncontributory.   Objective: Vital Signs: Ht 5\' 10"  (1.778 m)   Wt 192 lb (87.1 kg)   BMI 27.55 kg/m   Physical Exam Constitutional:      Appearance: He is well-developed.  HENT:     Head: Normocephalic and atraumatic.  Eyes:     Pupils: Pupils are equal, round, and reactive to light.  Neck:     Thyroid: No thyromegaly.     Trachea: No tracheal deviation.  Cardiovascular:     Rate and Rhythm: Normal rate.  Pulmonary:     Effort: Pulmonary effort is normal.     Breath sounds: No wheezing.  Abdominal:     General: Bowel sounds are normal.     Palpations: Abdomen is soft.  Skin:    General: Skin is warm and dry.     Capillary Refill: Capillary refill takes less than 2 seconds.  Neurological:     Mental Status: He is alert and oriented to person, place, and time.  Psychiatric:        Behavior: Behavior normal.        Thought Content: Thought content normal.        Judgment: Judgment normal.     Ortho Exam patient has  negative drop arm test good range of motion cervical spine.  Exquisite tenderness over the lateral epicondyle pain with resisted wrist extension.  Tuberosity of the scaphoid is normal dorsal compartments are normal negative Finkelstein test.  Negative grind test.  Some tenderness over the scaphoid with palpation in the snuffbox. Specialty Comments:  No specialty comments available.  Imaging: XR Elbow 2 Views Right  Result Date: 04/08/2020 AP lateral right elbow obtained and reviewed.  This shows normal anatomy no degenerative changes negative for acute changes. Impression: Normal right elbow x-rays.    PMFS History: Patient Active Problem List   Diagnosis Date Noted  . Tennis elbow 04/08/2020  . Impingement syndrome of right shoulder 09/24/2019  . Cervical  myelopathy ([place]) 08/21/2018  . Syrinx of spinal cord ([place]) 08/21/2018  . Chronic neck pain 04/30/2015  . Peyronie's syndrome 04/30/2015  . Hyperlipidemia 01/26/2015  . HTN (hypertension) 05/04/2012  . HEMORRHOIDS, EXTERNAL 08/10/2010  . GROIN STRAIN 06/29/2010  . THRUSH 01/22/2009  . CARDIAC MURMUR, HX OF 01/22/2009   Past Medical History:  Diagnosis Date  . Arthritis   . Bulging of cervical intervertebral disc   . Cervical myelopathy ([place]) 08/21/2018  . Chicken pox   . Hyperlipidemia   . Hypertension   . Injury, nerve, musculocutaneous    Pinched nerve right side of neck.  . Syrinx of spinal cord ([place]) 08/21/2018   Cervical    Family History  Problem Relation Age of Onset  . Alcohol abuse Other   . Colon cancer Neg Hx   . Esophageal cancer Neg Hx   . Rectal cancer Neg Hx   . Stomach cancer Neg Hx   . Colon polyps Neg Hx     Past Surgical History:  Procedure Laterality Date  . ARTHROSCOPIC REPAIR ACL    . bone spurs     removed from right foot  . bunions     removed from right foot  . CERVICAL DISC SURGERY  01/2017   C5-6  . COLONOSCOPY  01/13/2017   per Dr. Havery Moros, sessile serrated polyps, repeat in 3 yrs   . EYE SURGERY     left eye repair  . KNEE SURGERY     reconstruction  . POLYPECTOMY    . SHOULDER ARTHROSCOPY W/ LABRAL REPAIR     Social History   Occupational History  . Not on file  Tobacco Use  . Smoking status: Never Smoker  . Smokeless tobacco: Never Used  Substance and Sexual Activity  . Alcohol use: Yes    Alcohol/week: 6.0 standard drinks    Types: 6 Cans of beer per week  . Drug use: No  . Sexual activity: Not on file

## 2020-04-14 MED ORDER — METHYLPREDNISOLONE ACETATE 40 MG/ML IJ SUSP
40.0000 mg | INTRAMUSCULAR | Status: AC | PRN
  Administered 2020-04-08: 40 mg via INTRA_ARTICULAR

## 2020-04-14 MED ORDER — BUPIVACAINE HCL 0.5 % IJ SOLN
1.0000 mL | INTRAMUSCULAR | Status: AC | PRN
  Administered 2020-04-08: 1 mL via INTRA_ARTICULAR

## 2020-04-14 MED ORDER — LIDOCAINE HCL 1 % IJ SOLN
0.5000 mL | INTRAMUSCULAR | Status: AC | PRN
  Administered 2020-04-08: .5 mL

## 2020-04-22 ENCOUNTER — Encounter: Payer: Self-pay | Admitting: Gastroenterology

## 2020-04-22 ENCOUNTER — Ambulatory Visit (INDEPENDENT_AMBULATORY_CARE_PROVIDER_SITE_OTHER): Payer: 59 | Admitting: Gastroenterology

## 2020-04-22 VITALS — BP 130/74 | HR 69 | Ht 70.0 in | Wt 198.6 lb

## 2020-04-22 DIAGNOSIS — K641 Second degree hemorrhoids: Secondary | ICD-10-CM | POA: Diagnosis not present

## 2020-04-22 NOTE — Patient Instructions (Signed)

## 2020-04-22 NOTE — Progress Notes (Signed)
59 year old male here for a visit for hemorrhoid banding today.  I know him from colonoscopy done last month at which point he was found to have internal hemorrhoids.  He had explained that he has been having longstanding symptoms of hemorrhoids for years.  He has frequent bleeding from them and occasional leakage, occasional prolapse.  Denies any constipation or diarrhea.  No straining.  We discussed options for treatment and he wants to proceed with hemorrhoid banding today.  Colonoscopy 03/16/2020 - The perianal and digital rectal examinations were normal other than hemorrhoids noted. - Multiple benign appearing flat polyps were found in the rectum, grossly consistent with hyperplastic polyps. The polyps were small in size. Biopsies were taken with a cold forceps for histology from a representative sample - polyp removed. - Internal hemorrhoids were found during retroflexion. - The exam was otherwise without abnormality.   PROCEDURE NOTE: The patient presents with symptomatic grade II  hemorrhoids, requesting rubber band ligation of his/her hemorrhoidal disease.  All risks, benefits and alternative forms of therapy were described and informed consent was obtained.   The anorectum was pre-medicated with 0.125% nitroglycerin The decision was made to band the LL internal hemorrhoid, and the [place] was used to perform band ligation without complication.  Digital anorectal examination was then performed to assure proper positioning of the band, and to adjust the banded tissue as required.  The patient was discharged home without pain or other issues.  Dietary and behavioral recommendations were given and along with follow-up instructions.     The patient will return in 2-4 weeks for  follow-up and possible additional banding as required. No complications were encountered and the patient tolerated the procedure well.  [place] Cellar, MD [facility]

## 2020-05-12 ENCOUNTER — Ambulatory Visit (INDEPENDENT_AMBULATORY_CARE_PROVIDER_SITE_OTHER): Payer: 59 | Admitting: Gastroenterology

## 2020-05-12 ENCOUNTER — Encounter: Payer: Self-pay | Admitting: Gastroenterology

## 2020-05-12 VITALS — BP 152/94 | HR 60 | Ht 70.0 in | Wt 199.0 lb

## 2020-05-12 DIAGNOSIS — K641 Second degree hemorrhoids: Secondary | ICD-10-CM

## 2020-05-12 NOTE — Patient Instructions (Signed)
If you are age 57 or older, your body mass index should be between 23-30. Your Body mass index is 28.55 kg/m. If this is out of the aforementioned range listed, please consider follow up with your Primary Care Provider.  If you are age 72 or younger, your body mass index should be between 19-25. Your Body mass index is 28.55 kg/m. If this is out of the aformentioned range listed, please consider follow up with your Primary Care Provider.   HEMORRHOID BANDING PROCEDURE    FOLLOW-UP CARE   1. The procedure you have had should have been relatively painless since the banding of the area involved does not have nerve endings and there is no pain sensation.  The rubber band cuts off the blood supply to the hemorrhoid and the band may fall off as soon as 48 hours after the banding (the band may occasionally be seen in the toilet bowl following a bowel movement). You may notice a temporary feeling of fullness in the rectum which should respond adequately to plain Tylenol or Motrin.  2. Following the banding, avoid strenuous exercise that evening and resume full activity the next day.  A sitz bath (soaking in a warm tub) or bidet is soothing, and can be useful for cleansing the area after bowel movements.     3. To avoid constipation, take two tablespoons of natural wheat bran, natural oat bran, flax, Benefiber or any over the counter fiber supplement and increase your water intake to 7-8 glasses daily.    4. Unless you have been prescribed anorectal medication, do not put anything inside your rectum for two weeks: No suppositories, enemas, fingers, etc.  5. Occasionally, you may have more bleeding than usual after the banding procedure.  This is often from the untreated hemorrhoids rather than the treated one.  Don't be concerned if there is a tablespoon or so of blood.  If there is more blood than this, lie flat with your bottom higher than your head and apply an ice pack to the area. If the bleeding  does not stop within a half an hour or if you feel faint, call our office at [phone] or go to the emergency room.  6. Problems are not common; however, if there is a substantial amount of bleeding, severe pain, chills, fever or difficulty passing urine (very rare) or other problems, you should call us at [phone] or report to the nearest emergency room.  7. Do not stay seated continuously for more than 2-3 hours for a day or two after the procedure.  Tighten your buttock muscles 10-15 times every two hours and take 10-15 deep breaths every 1-2 hours.  Do not spend more than a few minutes on the toilet if you cannot empty your bowel; instead re-visit the toilet at a later time.    Your 3rd and final banding appointment is scheduled for Monday, 8-16 at 4:00pm.  If you need to reschedule this appointment, please call as soon as possible: [phone].  Thank you.  Thank you for entrusting me with your care and for choosing [facility], Dr. Hodges Cellar

## 2020-05-12 NOTE — Progress Notes (Signed)
59 year old male here for a visit for hemorrhoid banding.  I know him from colonoscopy done last month at which point he was found to have internal hemorrhoids.  He had explained that he has been having longstanding symptoms of hemorrhoids for years.  He has frequent bleeding from them and occasional leakage, occasional prolapse.  Denies any constipation or diarrhea.  No straining.  We discussed options for treatment and he wants to proceed with hemorrhoid banding, the LL was banded on 04/22/20. He tolerated it really well, no pain or bleeding, states he has had some benefit from the banding and wishes to continue with treatment.   Colonoscopy 03/16/2020 - The perianal and digital rectal examinations were normal other than hemorrhoids noted. - Multiple benign appearing flat polyps were found in the rectum, grossly consistent with hyperplastic polyps. The polyps were small in size. Biopsies were taken with a cold forceps for histology from a representative sample - polyp removed. - Internal hemorrhoids were found during retroflexion. - The exam was otherwise without abnormality.   PROCEDURE NOTE: The patient presents with symptomatic grade II  hemorrhoids, requesting rubber band ligation of his/her hemorrhoidal disease. All risks, benefits and alternative forms of therapy were described and informed consent was obtained.   The anorectum was pre-medicated with 0.125% nitroglycerin The decision was made to band the RP internal hemorrhoid, and the [place] was used to perform band ligation without complication. Digital anorectal examination was then performed to assure proper positioning of the band, and to adjust the banded tissue as required.  The patient was discharged home without pain or other issues. Dietary and behavioral recommendations were given and along with follow-up instructions.    The patient will return in 2-4 weeks for  follow-up and possible additional banding as  required. No complications were encountered and the patient tolerated the procedure well.  [place] Cellar, MD [facility]

## 2020-06-03 ENCOUNTER — Encounter: Payer: 59 | Admitting: Gastroenterology

## 2020-06-29 ENCOUNTER — Encounter: Payer: 59 | Admitting: Gastroenterology

## 2020-08-04 ENCOUNTER — Encounter: Payer: 59 | Admitting: Gastroenterology

## 2020-09-07 ENCOUNTER — Telehealth: Payer: Self-pay

## 2020-09-07 NOTE — Telephone Encounter (Signed)
Called and LM for pt to call back to discuss his appt on Thursday, 10-28 for 3rd banding. He has cancelled several appts in the last few months.  Asked that he call back to confirm if he intend to come this Thursday or if he would he like to cancel that appt if he is no longer interested in banding.

## 2020-09-10 ENCOUNTER — Ambulatory Visit (INDEPENDENT_AMBULATORY_CARE_PROVIDER_SITE_OTHER): Payer: 59 | Admitting: Gastroenterology

## 2020-09-10 ENCOUNTER — Encounter: Payer: Self-pay | Admitting: Gastroenterology

## 2020-09-10 VITALS — BP 132/76 | HR 64 | Ht 70.0 in | Wt 202.0 lb

## 2020-09-10 DIAGNOSIS — K641 Second degree hemorrhoids: Secondary | ICD-10-CM

## 2020-09-10 DIAGNOSIS — K6289 Other specified diseases of anus and rectum: Secondary | ICD-10-CM

## 2020-09-10 MED ORDER — AMBULATORY NON FORMULARY MEDICATION: 0 refills | Status: DC

## 2020-09-10 NOTE — Progress Notes (Signed)
HPI :  59 year old male here for a visit for hemorrhoid banding. I know him from colonoscopy done 5/21 at which time he was found to have internal hemorrhoids. He had explained that he has been having longstanding symptoms of hemorrhoids for years. He has frequent bleeding from them previously. Denies any constipation or diarrhea. No straining. We discussed options for treatment and he wanted to proceed with hemorrhoid banding, the LL was banded on 04/22/20. The RP was then banded on 05/12/2020.  He states he had some discomfort after the second banding that essentially he does not think has gone away.  He states he feels " constipated", when asked what he means by that he feels some sense of constant discomfort in his anal canal there.  Is been ongoing for a few months now, is the first I have heard of this.  He was not able to make his prior appointments with [place] due to work schedule.  He has a few bowel movements a day.  Denies any straining.  He states the banding has significantly improve the bleeding that does not really bother him much anymore and is happy about that, however a bit concerned about the ongoing discomfort in his rectum.  He was scheduled for his third banding today.  Colonoscopy 03/16/2020 -The perianal and digital rectal examinations were normal other than hemorrhoids noted. - Multiple benign appearing flat polyps were found in the rectum, grossly consistent with hyperplastic polyps. The polyps were small in size. Biopsies were taken with a cold forceps for histology from a representative sample - polyp removed. - Internal hemorrhoids were found during retroflexion. - The exam was otherwise without abnormality.       Past Medical History:  Diagnosis Date   Arthritis    Bulging of cervical intervertebral disc    Cervical myelopathy ([facility]) 08/21/2018   Chicken pox    Hyperlipidemia    Hypertension    Injury, nerve, musculocutaneous    Pinched nerve right  side of neck.   Syrinx of spinal cord ([place]) 08/21/2018   Cervical     Past Surgical History:  Procedure Laterality Date   ARTHROSCOPIC REPAIR ACL     bone spurs     removed from right foot   bunions     removed from right foot   CERVICAL DISC SURGERY  01/2017   C5-6   COLONOSCOPY  01/13/2017   per Dr. Havery Moros, sessile serrated polyps, repeat in 3 yrs    EYE SURGERY     left eye repair   KNEE SURGERY     reconstruction   POLYPECTOMY     SHOULDER ARTHROSCOPY W/ LABRAL REPAIR     Family History  Problem Relation Age of Onset   Alcohol abuse Other    Colon cancer Neg Hx    Esophageal cancer Neg Hx    Rectal cancer Neg Hx    Stomach cancer Neg Hx    Colon polyps Neg Hx    Social History   Tobacco Use   Smoking status: Never Smoker   Smokeless tobacco: Never Used  Vaping Use   Vaping Use: Never used  Substance Use Topics   Alcohol use: Yes    Alcohol/week: 6.0 standard drinks    Types: 6 Cans of beer per week   Drug use: No   Current Outpatient Medications  Medication Sig Dispense Refill   amLODipine (NORVASC) 5 MG tablet Take 1 tablet (5 mg total) by mouth daily. 90 tablet 3  atorvastatin (LIPITOR) 20 MG tablet TAKE 1 TABLET BY MOUTH  DAILY 90 tablet 3   AMBULATORY NON FORMULARY MEDICATION Medication Name: Nitroglycerine ointment 0.125 %  Apply a pea sized amount internally three times daily. Dispense 30 GM zero refill 30 g 0   No current facility-administered medications for this visit.   No Known Allergies   Review of Systems: All systems reviewed and negative except where noted in HPI.   Lab Results  Component Value Date   WBC 4.6 01/24/2020   HGB 15.6 01/24/2020   HCT 43.9 01/24/2020   MCV 95.0 01/24/2020   PLT 194.0 01/24/2020     Physical Exam: BP 132/76    Pulse 64    Ht 5\' 10"  (1.778 m)    Wt 202 lb (91.6 kg)    SpO2 99%    BMI 28.98 kg/m  Constitutional: Pleasant,well-developed, male in no acute distress. DRE -  hemorrhoids noted, no obvious fissure however patient with discomfort with DRE in the anal canal, could not adequately evaluate this issue due to pain. Anoscopy not possible. No purulence of evidence of perianal fistula / abscess, etc.   ASSESSMENT AND PLAN: 59 year old male here for reassessment of the following:  Rectal pain / internal hemorrhoids - has had improvement in bleeding symptoms following 2 bandings, however after the last banding has had some persistent discomfort in his anal canal. He is able to function and seems like ongoing mild discomfort, however  I can reproduce his symptoms with palpation of the area on DRE however due to his discomfort with the exam it is really hard to evaluate the area, anoscopy not possible.  It is possible he has an anal fissure there versus poor wound healing/ulceration from the prior band.  Generally following a banding if there is pain it usually abates within a few days, this is been going on a few months, which is very atypical.  I discussed options with him.  We will try some topical 0.125% nitroglycerin ointment 3 times daily and see if this helps at all over the next few weeks.  I also gave him some samples of RectiCare to use as needed to take the edge off.  If this fails to improve and his symptoms persist we may can need to consider a flex sig.  Advised him we will not be doing any further banding until his pain is improved.  He had no concerning pathology in his rectum otherwise during colonoscopy.  He agreed with the plan, I asked him to touch base with me in 2 weeks for an update.  He agreed  [place] Cellar, MD [facility]

## 2020-09-10 NOTE — Patient Instructions (Addendum)
If you are age 57 or older, your body mass index should be between 23-30. Your Body mass index is 28.98 kg/m. If this is out of the aforementioned range listed, please consider follow up with your Primary Care Provider.  If you are age 91 or younger, your body mass index should be between 19-25. Your Body mass index is 28.98 kg/m. If this is out of the aformentioned range listed, please consider follow up with your Primary Care Provider.   _______________________________________________________________________________  We have sent a prescription for nitroglycerin 0.125% gel to [facility]. You should apply a pea size amount to your rectum three times daily x 6 weeks.  [facility]'s information is below: Address: [address] [place], [place] [address]  [phone]  *Please DO NOT go directly from our office to pick up this medication! Give the pharmacy 1 day to process the prescription as this is compounded and takes time to make.  __________________________________________________________________________  We have given you samples of the following medication to take: RectiCare: Use as directed as needed  Thank you for entrusting me with your care and for choosing [facility], Dr. Corona Cellar

## 2020-10-28 ENCOUNTER — Other Ambulatory Visit: Payer: Self-pay | Admitting: Family Medicine

## 2020-11-04 ENCOUNTER — Other Ambulatory Visit: Payer: Self-pay

## 2020-11-04 ENCOUNTER — Telehealth: Payer: Self-pay | Admitting: Family Medicine

## 2020-11-04 MED ORDER — ATORVASTATIN CALCIUM 20 MG PO TABS: 20.0000 mg | ORAL_TABLET | Freq: Every day | ORAL | 1 refills | Status: DC

## 2020-11-04 NOTE — Telephone Encounter (Signed)
atorvastatin (LIPITOR) 20 MG tablet   [facility] [id number] [place] [address], [place] Phone:  [phone]  Fax:  [phone]

## 2020-12-28 ENCOUNTER — Other Ambulatory Visit: Payer: Self-pay | Admitting: Family Medicine

## 2021-01-25 ENCOUNTER — Encounter: Payer: Self-pay | Admitting: Family Medicine

## 2021-01-25 ENCOUNTER — Other Ambulatory Visit: Payer: Self-pay

## 2021-01-25 ENCOUNTER — Ambulatory Visit (INDEPENDENT_AMBULATORY_CARE_PROVIDER_SITE_OTHER): Payer: BC Managed Care – PPO | Admitting: Family Medicine

## 2021-01-25 VITALS — BP 132/80 | HR 60 | Temp 98.1°F | Ht 70.0 in | Wt 202.0 lb

## 2021-01-25 DIAGNOSIS — R079 Chest pain, unspecified: Secondary | ICD-10-CM

## 2021-01-25 DIAGNOSIS — Z125 Encounter for screening for malignant neoplasm of prostate: Secondary | ICD-10-CM

## 2021-01-25 DIAGNOSIS — Z0000 Encounter for general adult medical examination without abnormal findings: Secondary | ICD-10-CM | POA: Diagnosis not present

## 2021-01-25 LAB — HEPATIC FUNCTION PANEL
ALT: 55 U/L — ABNORMAL HIGH (ref 0–53)
AST: 32 U/L (ref 0–37)
Albumin: 4.5 g/dL (ref 3.5–5.2)
Alkaline Phosphatase: 54 U/L (ref 39–117)
Bilirubin, Direct: 0.1 mg/dL (ref 0.0–0.3)
Total Bilirubin: 0.7 mg/dL (ref 0.2–1.2)
Total Protein: 6.9 g/dL (ref 6.0–8.3)

## 2021-01-25 LAB — CBC WITH DIFFERENTIAL/PLATELET
Basophils Absolute: 0 10*3/uL (ref 0.0–0.1)
Basophils Relative: 0.6 % (ref 0.0–3.0)
Eosinophils Absolute: 0.2 10*3/uL (ref 0.0–0.7)
Eosinophils Relative: 4 % (ref 0.0–5.0)
HCT: 44.2 % (ref 39.0–52.0)
Hemoglobin: 15.4 g/dL (ref 13.0–17.0)
Lymphocytes Relative: 31.1 % (ref 12.0–46.0)
Lymphs Abs: 1.3 10*3/uL (ref 0.7–4.0)
MCHC: 34.8 g/dL (ref 30.0–36.0)
MCV: 94.9 fl (ref 78.0–100.0)
Monocytes Absolute: 0.4 10*3/uL (ref 0.1–1.0)
Monocytes Relative: 9.7 % (ref 3.0–12.0)
Neutro Abs: 2.3 10*3/uL (ref 1.4–7.7)
Neutrophils Relative %: 54.6 % (ref 43.0–77.0)
Platelets: 197 10*3/uL (ref 150.0–400.0)
RBC: 4.66 Mil/uL (ref 4.22–5.81)
RDW: 12.4 % (ref 11.5–15.5)
WBC: 4.2 10*3/uL (ref 4.0–10.5)

## 2021-01-25 LAB — LIPID PANEL
Cholesterol: 160 mg/dL (ref 0–200)
HDL: 49.5 mg/dL (ref >39.00)
LDL Cholesterol: 91 mg/dL (ref 0–99)
NonHDL: 110.19
Total CHOL/HDL Ratio: 3
Triglycerides: 97 mg/dL (ref 0.0–149.0)
VLDL: 19.4 mg/dL (ref 0.0–40.0)

## 2021-01-25 LAB — BASIC METABOLIC PANEL
BUN: 15 mg/dL (ref 6–23)
CO2: 30 mEq/L (ref 19–32)
Calcium: 9.4 mg/dL (ref 8.4–10.5)
Chloride: 103 mEq/L (ref 96–112)
Creatinine, Ser: 0.88 mg/dL (ref 0.40–1.50)
GFR: 95.62 mL/min (ref >60.00)
Glucose, Bld: 94 mg/dL (ref 70–99)
Potassium: 4.1 mEq/L (ref 3.5–5.1)
Sodium: 140 mEq/L (ref 135–145)

## 2021-01-25 LAB — TSH: TSH: 0.86 u[IU]/mL (ref 0.35–4.50)

## 2021-01-25 LAB — PSA: PSA: 0.43 ng/mL (ref 0.10–4.00)

## 2021-01-25 NOTE — Progress Notes (Signed)
Subjective:    Patient ID: Todd Reid, male    DOB: 1960/11/21, 60 y.o.   MRN: [id number]  HPI Here for a well exam. He has been seeing Dr. Havery Moros for hemorrhoids. He had 2 of them banded, and he is applying topical agents to the rest . His BP has been stable. He does relate several episodes of heart racing or chest pressure in the past year. One time he was running on the beach and he developed a mild pressure in the left chest area. He also had some SOB at that time. He  stopped to rest and this went away after about an hour. He also had another less intense episode a few weeks ago when he was on a ladder with his arms raised above his head, to hang some outdoor lights. This episode only lasted about 2 minutes and resolved. He has not been exercising at all for the past year.    Review of Systems  Constitutional: Negative.   HENT: Negative.   Eyes: Negative.   Respiratory: Positive for chest tightness.   Cardiovascular: Positive for palpitations.  Gastrointestinal: Negative.   Genitourinary: Negative.   Musculoskeletal: Negative.   Skin: Negative.   Neurological: Negative.   Psychiatric/Behavioral: Negative.        Objective:   Physical Exam Constitutional:      General: He is not in acute distress.    Appearance: He is well-developed. He is not diaphoretic.  HENT:     Head: Normocephalic and atraumatic.     Right Ear: External ear normal.     Left Ear: External ear normal.     Nose: Nose normal.     Mouth/Throat:     Pharynx: No oropharyngeal exudate.  Eyes:     General: No scleral icterus.       Right eye: No discharge.        Left eye: No discharge.     Conjunctiva/sclera: Conjunctivae normal.     Pupils: Pupils are equal, round, and reactive to light.  Neck:     Thyroid: No thyromegaly.     Vascular: No JVD.     Trachea: No tracheal deviation.  Cardiovascular:     Rate and Rhythm: Normal rate and regular rhythm.     Heart sounds: Normal heart  sounds. No murmur heard. No friction rub. No gallop.   Pulmonary:     Effort: Pulmonary effort is normal. No respiratory distress.     Breath sounds: Normal breath sounds. No wheezing or rales.  Chest:     Chest wall: No tenderness.  Abdominal:     General: Bowel sounds are normal. There is no distension.     Palpations: Abdomen is soft. There is no mass.     Tenderness: There is no abdominal tenderness. There is no guarding or rebound.  Genitourinary:    Penis: Normal. No tenderness.      Testes: Normal.     Prostate: Normal.     Rectum: Normal. Guaiac result negative.  Musculoskeletal:        General: No tenderness. Normal range of motion.     Cervical back: Neck supple.  Lymphadenopathy:     Cervical: No cervical adenopathy.  Skin:    General: Skin is warm and dry.     Coloration: Skin is not pale.     Findings: No erythema or rash.  Neurological:     Mental Status: He is alert and oriented to person, place, and time.  Cranial Nerves: No cranial nerve deficit.     Motor: No abnormal muscle tone.     Coordination: Coordination normal.     Deep Tendon Reflexes: Reflexes are normal and symmetric. Reflexes normal.  Psychiatric:        Behavior: Behavior normal.        Thought Content: Thought content normal.        Judgment: Judgment normal.           Assessment & Plan:  Well exam. We discussed diet and exercise. Get fasting labs. Set up a Myoview treadmill soon.  Alysia Penna, MD

## 2021-02-04 ENCOUNTER — Telehealth (HOSPITAL_COMMUNITY): Payer: Self-pay | Admitting: *Deleted

## 2021-02-04 NOTE — Telephone Encounter (Signed)
Left message on voicemail per DPR in reference to upcoming appointment scheduled on 02/08/21 at 7:30 with detailed instructions given per Myocardial Perfusion Study Information Sheet for the test. LM to arrive 15 minutes early, and that it is imperative to arrive on time for appointment to keep from having the test rescheduled. If you need to cancel or reschedule your appointment, please call the office within 24 hours of your appointment. Failure to do so may result in a cancellation of your appointment, and a $50 no show fee. Phone number given for call back for any questions.

## 2021-02-05 ENCOUNTER — Other Ambulatory Visit (HOSPITAL_COMMUNITY)
Admission: RE | Admit: 2021-02-05 | Discharge: 2021-02-05 | Disposition: A | Discharge status: Home / Self Care | Payer: BC Managed Care – PPO | Source: Ambulatory Visit | Attending: Family Medicine | Admitting: Family Medicine

## 2021-02-05 DIAGNOSIS — Z01812 Encounter for preprocedural laboratory examination: Secondary | ICD-10-CM | POA: Insufficient documentation

## 2021-02-05 DIAGNOSIS — Z20822 Contact with and (suspected) exposure to covid-19: Secondary | ICD-10-CM | POA: Diagnosis not present

## 2021-02-05 LAB — SARS CORONAVIRUS 2 (TAT 6-24 HRS): SARS Coronavirus 2: NEGATIVE

## 2021-02-08 ENCOUNTER — Ambulatory Visit (HOSPITAL_COMMUNITY): Payer: BC Managed Care – PPO | Attending: Internal Medicine

## 2021-02-08 ENCOUNTER — Other Ambulatory Visit: Payer: Self-pay

## 2021-02-08 DIAGNOSIS — R079 Chest pain, unspecified: Secondary | ICD-10-CM | POA: Diagnosis not present

## 2021-02-08 LAB — MYOCARDIAL PERFUSION IMAGING
Estimated workload: 11.7 METS
Exercise duration (min): 10 min
LV dias vol: 111 mL (ref 62–150)
LV sys vol: 42 mL
MPHR: 163 {beats}/min
Peak HR: 146 {beats}/min
Percent HR: 89 %
RPE: 19
Rest HR: 58 {beats}/min
SDS: 0
SRS: 0
SSS: 0
TID: 0.85

## 2021-02-08 MED ORDER — TECHNETIUM TC 99M TETROFOSMIN IV KIT
10.7000 | PACK | Freq: Once | INTRAVENOUS | Status: AC | PRN
  Administered 2021-02-08: 10.7 via INTRAVENOUS
  Filled 2021-02-08: qty 11

## 2021-02-08 MED ORDER — TECHNETIUM TC 99M TETROFOSMIN IV KIT
31.1000 | PACK | Freq: Once | INTRAVENOUS | Status: AC | PRN
  Administered 2021-02-08: 31.1 via INTRAVENOUS
  Filled 2021-02-08: qty 32

## 2021-02-23 DIAGNOSIS — Z20822 Contact with and (suspected) exposure to covid-19: Secondary | ICD-10-CM | POA: Diagnosis not present

## 2021-03-29 ENCOUNTER — Other Ambulatory Visit: Payer: Self-pay | Admitting: Family Medicine

## 2021-04-18 ENCOUNTER — Encounter: Payer: Self-pay | Admitting: Family Medicine

## 2021-04-19 NOTE — Telephone Encounter (Signed)
Make an in person OV with me tomorrow

## 2021-04-20 ENCOUNTER — Other Ambulatory Visit: Payer: Self-pay

## 2021-04-20 ENCOUNTER — Ambulatory Visit: Payer: BC Managed Care – PPO | Admitting: Family Medicine

## 2021-04-20 ENCOUNTER — Encounter: Payer: Self-pay | Admitting: Family Medicine

## 2021-04-20 VITALS — BP 130/88 | HR 67 | Temp 98.0°F | Wt 204.0 lb

## 2021-04-20 DIAGNOSIS — U071 COVID-19: Secondary | ICD-10-CM

## 2021-04-20 NOTE — Progress Notes (Signed)
   Subjective:    Patient ID: Todd Reid, male    DOB: 10/01/1961, 60 y.o.   MRN: [id number]  HPI Here to follow up a Covid-19 infection. He began to feel body aches, fatigue, and a dry cough on 04-04-21, and on 04-07-21 he tested positive for the virus. He did not seek any medical attention and his symptoms quickly improved. However since then he has felt some lingering fatigue and mild chest heaviness. No fevers. He went back to work yesterday.    Review of Systems  Constitutional: Positive for fatigue.  HENT: Negative.   Eyes: Negative.   Respiratory: Positive for chest tightness. Negative for cough, shortness of breath and wheezing.   Cardiovascular: Negative.        Objective:   Physical Exam Constitutional:      Appearance: Normal appearance. He is not ill-appearing.  Cardiovascular:     Rate and Rhythm: Normal rate and regular rhythm.     Pulses: Normal pulses.     Heart sounds: Normal heart sounds.  Pulmonary:     Effort: Pulmonary effort is normal.     Breath sounds: Normal breath sounds.  Neurological:     Mental Status: He is alert.           Assessment & Plan:  He has recovered from a Covid-19 infection. I reassured him it is common to take a few weeks to months to fully feel back to normal. He will return as needed.  Alysia Penna, MD

## 2021-05-02 ENCOUNTER — Other Ambulatory Visit: Payer: Self-pay | Admitting: Family Medicine

## 2021-07-07 ENCOUNTER — Encounter: Payer: Self-pay | Admitting: Family Medicine

## 2021-07-07 DIAGNOSIS — M79676 Pain in unspecified toe(s): Secondary | ICD-10-CM

## 2021-07-08 NOTE — Telephone Encounter (Signed)
I did the referral 

## 2021-07-14 ENCOUNTER — Ambulatory Visit: Payer: BC Managed Care – PPO | Admitting: Podiatry

## 2021-07-14 ENCOUNTER — Ambulatory Visit (INDEPENDENT_AMBULATORY_CARE_PROVIDER_SITE_OTHER): Payer: BC Managed Care – PPO

## 2021-07-14 ENCOUNTER — Other Ambulatory Visit: Payer: Self-pay

## 2021-07-14 ENCOUNTER — Encounter: Payer: Self-pay | Admitting: Podiatry

## 2021-07-14 DIAGNOSIS — M778 Other enthesopathies, not elsewhere classified: Secondary | ICD-10-CM | POA: Diagnosis not present

## 2021-07-14 DIAGNOSIS — M205X1 Other deformities of toe(s) (acquired), right foot: Secondary | ICD-10-CM

## 2021-07-14 DIAGNOSIS — M205X2 Other deformities of toe(s) (acquired), left foot: Secondary | ICD-10-CM

## 2021-07-14 MED ORDER — TRIAMCINOLONE ACETONIDE 10 MG/ML IJ SUSP
20.0000 mg | Freq: Once | INTRAMUSCULAR | Status: AC
Start: 2021-07-14 — End: 2021-07-14
  Administered 2021-07-14: 20 mg

## 2021-07-14 NOTE — Progress Notes (Signed)
Subjective:   Patient ID: Todd Reid, male   DOB: 61 y.o.   MRN: [id number]   HPI Patient presents stating he still has problems with his big toe joint both feet admitting he should have been here earlier and states that his orthotics are starting to wear out and he is getting discomfort in the joint and is getting ready to go to the [place] trail hiking.  States he feels like it is gradually gotten worse but not severe   ROS      Objective:  Physical Exam  Neurovascular status intact with diminished range of motion first MPJ right over left with inflammation fluid around the joint surfaces and surgery of approximate 28-year history of right foot for spur removal     Assessment:  Hallux limitus deformity bilateral with inflammatory changes around the first MPJ with gradual worsening of condition     Plan:  H&P x-rays reviewed condition discussed at great length.  At this point I did sterile prep and injected around the first MPJ bilateral 3 mg Kenalog 5 mg Xylocaine to reduce the inflammatory process reviewed his x-rays and went ahead and casted for new orthotics that will be made in reverse Morton's extension.  Patient will be seen back and is encouraged to call with questions concerns ultimately may require shortening osteotomies  X-rays indicate elongated first metatarsal segment bilateral with spur formation and moderate changes to the joint surfaces but still open and rounded

## 2021-07-14 NOTE — Patient Instructions (Signed)
Hallux Rigidus Hallux rigidus is a type of joint pain or joint disease (degenerativearthritis) that affects your big toe (hallux). This condition involves the joint that connects the base of your big toe to the main part of your foot (metatarsophalangeal joint or MTP joint). This condition can cause your big toe to become stiff, painful, and difficult to move. Symptoms may get worse with movement or in cold or damp weather. The condition gets worse over time. What are the causes? This condition may be caused by having a foot that does not function the way that it should or that has an abnormal shape (structural deformity). These foot problems can run in families and may be passed down from parents to children (are hereditary). This condition can also be caused by: Injury. Overuse. What increases the risk? You are more likely to develop this condition if you have: A foot bone (metatarsal) that is longer or higher than normal. A family history of hallux rigidus. Previously injured your big toe. Feet that do not have a curve (arch) on the inner side of the foot. This may be called flat feet or fallen arches. Ankles that turn in when you walk (pronation). Rheumatoid arthritis or gout. A job that requires you to stoop down often at work. What are the signs or symptoms? Symptoms of this condition include: Big toe pain. Stiffness and difficulty moving the big toe. Swelling of the toe and surrounding area. Bone spurs. These are bony growths that can form on the joint of the big toe. A limp. How is this diagnosed? This condition is diagnosed based on your medical history and a physical exam. You may also have X-rays. How is this treated? This condition is treated by: Wearing roomy, comfortable shoes that have a large toe box. Putting orthotic devices in your shoes. Taking pain medicines. Having physical therapy. Icing the injured area. Alternating between putting your foot in cold water and  then in warm water. If your condition is severe, treatment may include: Corticosteroid injections to relieve pain. Surgery to remove bone spurs, fuse damaged bones together, or replace the entire joint. Follow these instructions at home: Managing pain, stiffness, and swelling  Put your feet in cold water for 30 seconds, and then in warm water for 30 seconds. Alternate between the cold and warm water for 5 minutes. Do this several times a day or as told by your health care provider. If directed, put ice on the injured area. Put ice in a plastic bag. Place a towel between your skin and the bag. Leave the ice on for 20 minutes, 2-3 times a day. General instructions Take over-the-counter and prescription medicines only as told by your health care provider. Do not wear high heels or other restrictive footwear. Wear comfortable, supportive shoes that have a large toe box. Wear shoe inserts (orthotics) as told by your health care provider, if this applies. Do foot exercises as instructed by your health care provider or a physical therapist. Keep all follow-up visits as told by your health care provider. This is important. Contact a health care provider if: You notice bone spurs or growths on or around your big toe. Your pain does not get better or it gets worse. You have pain while resting. You have pain in other parts of your body, such as your back, hip, or knee. You start to limp. Summary Hallux rigidus is a condition that makes your big toe become stiff, painful, and difficult to move. It can be caused  by injury, overuse, or inflammatory diseases. This condition may be treated with ice, medicines, physical therapy, and surgery. Do not wear high heels or other restrictive footwear. Wear comfortable, supportive shoes that have a large toe box. This information is not intended to replace advice given to you by your health care provider. Make sure you discuss any questions you have with your  health care provider. Document Revised: 03/11/2021 Document Reviewed: 03/11/2021 Elsevier Patient Education  2022 [employer].

## 2021-07-22 DIAGNOSIS — L570 Actinic keratosis: Secondary | ICD-10-CM | POA: Diagnosis not present

## 2021-07-22 DIAGNOSIS — D485 Neoplasm of uncertain behavior of skin: Secondary | ICD-10-CM | POA: Diagnosis not present

## 2021-07-22 DIAGNOSIS — D225 Melanocytic nevi of trunk: Secondary | ICD-10-CM | POA: Diagnosis not present

## 2021-07-22 DIAGNOSIS — Z85828 Personal history of other malignant neoplasm of skin: Secondary | ICD-10-CM | POA: Diagnosis not present

## 2021-07-22 DIAGNOSIS — D2262 Melanocytic nevi of left upper limb, including shoulder: Secondary | ICD-10-CM | POA: Diagnosis not present

## 2021-07-22 DIAGNOSIS — L821 Other seborrheic keratosis: Secondary | ICD-10-CM | POA: Diagnosis not present

## 2021-08-11 ENCOUNTER — Other Ambulatory Visit: Payer: Self-pay

## 2021-08-11 ENCOUNTER — Ambulatory Visit (INDEPENDENT_AMBULATORY_CARE_PROVIDER_SITE_OTHER): Payer: BC Managed Care – PPO

## 2021-08-11 DIAGNOSIS — M205X2 Other deformities of toe(s) (acquired), left foot: Secondary | ICD-10-CM

## 2021-08-11 DIAGNOSIS — M205X1 Other deformities of toe(s) (acquired), right foot: Secondary | ICD-10-CM

## 2021-08-11 NOTE — Progress Notes (Signed)
Patient in office today to pick up custom orthotics. Patient tried orthotics on and stated he was satisfied with the fit and feel of the custom orthotics at this time. Educated patient on the break-in process for the custom orthotics. Patient verbalized understanding. Advised patient to call the office with any questions, comments, or concerns.

## 2021-09-27 ENCOUNTER — Ambulatory Visit: Payer: BC Managed Care – PPO | Admitting: Podiatry

## 2021-09-30 ENCOUNTER — Other Ambulatory Visit: Payer: Self-pay | Admitting: Family Medicine

## 2021-09-30 ENCOUNTER — Ambulatory Visit: Payer: BC Managed Care – PPO | Admitting: Podiatry

## 2021-09-30 ENCOUNTER — Other Ambulatory Visit: Payer: Self-pay

## 2021-09-30 ENCOUNTER — Encounter: Payer: Self-pay | Admitting: Podiatry

## 2021-09-30 DIAGNOSIS — M778 Other enthesopathies, not elsewhere classified: Secondary | ICD-10-CM

## 2021-09-30 DIAGNOSIS — M205X2 Other deformities of toe(s) (acquired), left foot: Secondary | ICD-10-CM | POA: Diagnosis not present

## 2021-09-30 DIAGNOSIS — M205X1 Other deformities of toe(s) (acquired), right foot: Secondary | ICD-10-CM | POA: Diagnosis not present

## 2021-09-30 NOTE — Progress Notes (Signed)
Subjective:   Patient ID: Todd Reid, male   DOB: 60 y.o.   MRN: [id number]   HPI Patient states overall he is doing pretty well and states that he did have some problems with his big toe joint going downhill from a hike but overall the pain has been pretty minimal and the orthotics been helpful but he needs a new pair so he can have rotation as it is difficult for him to do this   ROS      Objective:  Physical Exam  Neurovascular status intact with patient found to have significant range of motion loss first MPJ right over left with mild crepitus of the first MPJ right mild discomfort     Assessment:  Hallux limitus rigidus condition right over left with structural component gradually getting worse over time but still under reasonable control with orthotics and previous injections and shoe gear modifications     Plan:  H&P reviewed condition at great length.  At this point I do think ultimately he is going to need long-term surgery for the right but we will get a continue to monitor and hopefully be able to forestall this.  We did get him scheduled for a new orthotics today and we will have those fabricated and I discussed rigid bottom shoes I discussed anti-inflammatories physical therapy and I would like to take a look at this about every 6 months to 8 months to make sure that forward progression is not occurring.  Spent a great deal time going over this with him and he is scheduled to be seen back in new orthotics to be made

## 2021-10-01 ENCOUNTER — Other Ambulatory Visit: Payer: Self-pay | Admitting: Family Medicine

## 2021-10-15 DIAGNOSIS — H5203 Hypermetropia, bilateral: Secondary | ICD-10-CM | POA: Diagnosis not present

## 2021-10-15 DIAGNOSIS — H539 Unspecified visual disturbance: Secondary | ICD-10-CM | POA: Diagnosis not present

## 2021-10-20 ENCOUNTER — Telehealth: Payer: Self-pay | Admitting: Podiatry

## 2021-10-20 NOTE — Telephone Encounter (Signed)
Orthotics in.. lvm for pt to call to schedule an appt to pick them up. °

## 2021-12-28 ENCOUNTER — Other Ambulatory Visit: Payer: Self-pay | Admitting: Family Medicine

## 2022-01-27 DIAGNOSIS — D485 Neoplasm of uncertain behavior of skin: Secondary | ICD-10-CM | POA: Diagnosis not present

## 2022-01-27 DIAGNOSIS — L570 Actinic keratosis: Secondary | ICD-10-CM | POA: Diagnosis not present

## 2022-02-07 ENCOUNTER — Encounter: Payer: Self-pay | Admitting: Family Medicine

## 2022-02-10 ENCOUNTER — Other Ambulatory Visit: Payer: Self-pay

## 2022-02-10 MED ORDER — SILDENAFIL CITRATE 100 MG PO TABS: 100.0000 mg | ORAL_TABLET | Freq: Every day | ORAL | 3 refills | Status: DC | PRN

## 2022-02-10 NOTE — Telephone Encounter (Signed)
Pt was taking Sildenafil back in 2021, medication is nolonger on pt med list, Pt LOV was on 04/20/2021 ?Pt requests for a new Rx for this medication, please advise if ok to send new Rx to pt pharmacy  ?

## 2022-02-10 NOTE — Telephone Encounter (Signed)
Call in Sildenafil 100 mg to take once a day as needed, #30 with 3 rf  ?

## 2022-02-14 ENCOUNTER — Other Ambulatory Visit: Payer: Self-pay

## 2022-02-14 MED ORDER — SILDENAFIL CITRATE 20 MG PO TABS: 20.0000 mg | ORAL_TABLET | Freq: Every day | ORAL | 3 refills | Status: DC | PRN

## 2022-02-15 NOTE — Telephone Encounter (Signed)
Done

## 2022-06-16 ENCOUNTER — Other Ambulatory Visit: Payer: Self-pay | Admitting: Family Medicine

## 2022-06-16 NOTE — Telephone Encounter (Signed)
Pt needs appointment for further refills 

## 2022-07-10 ENCOUNTER — Other Ambulatory Visit: Payer: Self-pay | Admitting: Family Medicine

## 2022-07-22 ENCOUNTER — Telehealth: Payer: Self-pay | Admitting: Family Medicine

## 2022-07-22 ENCOUNTER — Other Ambulatory Visit: Payer: Self-pay

## 2022-07-22 MED ORDER — ATORVASTATIN CALCIUM 20 MG PO TABS: 20.0000 mg | ORAL_TABLET | Freq: Every day | ORAL | 0 refills | Status: DC

## 2022-07-22 NOTE — Telephone Encounter (Signed)
Spoke with patient, [facility] day supply [facility] due to last OV was 04/20/22.    Patient to call office back to schedule appointment.

## 2022-07-22 NOTE — Telephone Encounter (Signed)
Pt requesting refill atorvastatin (LIPITOR) 20 MG tablet  [facility] [place] [place]

## 2022-07-25 ENCOUNTER — Telehealth: Payer: Self-pay

## 2022-07-25 NOTE — Telephone Encounter (Signed)
Left a detailed  message for pt advising to call the office and schedule OV or  CPE for further refills

## 2022-07-28 DIAGNOSIS — D225 Melanocytic nevi of trunk: Secondary | ICD-10-CM | POA: Diagnosis not present

## 2022-07-28 DIAGNOSIS — L570 Actinic keratosis: Secondary | ICD-10-CM | POA: Diagnosis not present

## 2022-07-28 DIAGNOSIS — D485 Neoplasm of uncertain behavior of skin: Secondary | ICD-10-CM | POA: Diagnosis not present

## 2022-07-28 DIAGNOSIS — Z85828 Personal history of other malignant neoplasm of skin: Secondary | ICD-10-CM | POA: Diagnosis not present

## 2022-07-28 DIAGNOSIS — D0462 Carcinoma in situ of skin of left upper limb, including shoulder: Secondary | ICD-10-CM | POA: Diagnosis not present

## 2022-07-28 DIAGNOSIS — L821 Other seborrheic keratosis: Secondary | ICD-10-CM | POA: Diagnosis not present

## 2022-08-05 ENCOUNTER — Other Ambulatory Visit: Payer: Self-pay | Admitting: Family Medicine

## 2022-08-09 ENCOUNTER — Telehealth: Payer: Self-pay | Admitting: Family Medicine

## 2022-08-09 NOTE — Telephone Encounter (Signed)
Please disregard

## 2022-08-16 ENCOUNTER — Other Ambulatory Visit: Payer: Self-pay | Admitting: Family Medicine

## 2022-09-02 ENCOUNTER — Telehealth: Payer: Self-pay | Admitting: Family Medicine

## 2022-09-02 ENCOUNTER — Other Ambulatory Visit: Payer: Self-pay

## 2022-09-02 MED ORDER — AMLODIPINE BESYLATE 5 MG PO TABS: 5.0000 mg | ORAL_TABLET | Freq: Every day | ORAL | 1 refills | Status: DC

## 2022-09-02 NOTE — Telephone Encounter (Addendum)
Medication Refill Request amLODipine (NORVASC) 5 MG tablet  LOV:  04/20/2021  [facility] [id number], [place] Phone:  [phone]  Fax:  [phone]

## 2022-09-02 NOTE — Telephone Encounter (Signed)
[facility] [address].

## 2022-09-02 NOTE — Telephone Encounter (Signed)
  Pt already has an appt for a CPE in November.  Refills will be sent today to the pharmacy, as per S. Aida Puffer

## 2022-09-20 ENCOUNTER — Encounter: Payer: Self-pay | Admitting: Family Medicine

## 2022-09-20 ENCOUNTER — Ambulatory Visit (INDEPENDENT_AMBULATORY_CARE_PROVIDER_SITE_OTHER): Payer: BC Managed Care – PPO | Admitting: Family Medicine

## 2022-09-20 VITALS — BP 120/80 | HR 62 | Temp 97.7°F | Ht 69.75 in | Wt 203.0 lb

## 2022-09-20 DIAGNOSIS — Z0000 Encounter for general adult medical examination without abnormal findings: Secondary | ICD-10-CM

## 2022-09-20 DIAGNOSIS — H9312 Tinnitus, left ear: Secondary | ICD-10-CM

## 2022-09-20 DIAGNOSIS — K649 Unspecified hemorrhoids: Secondary | ICD-10-CM | POA: Diagnosis not present

## 2022-09-20 LAB — HEPATIC FUNCTION PANEL
ALT: 43 U/L (ref 0–53)
AST: 30 U/L (ref 0–37)
Albumin: 4.6 g/dL (ref 3.5–5.2)
Alkaline Phosphatase: 65 U/L (ref 39–117)
Bilirubin, Direct: 0.2 mg/dL (ref 0.0–0.3)
Total Bilirubin: 0.7 mg/dL (ref 0.2–1.2)
Total Protein: 6.9 g/dL (ref 6.0–8.3)

## 2022-09-20 LAB — LIPID PANEL
Cholesterol: 184 mg/dL (ref 0–200)
HDL: 49.3 mg/dL (ref >39.00)
LDL Cholesterol: 101 mg/dL — ABNORMAL HIGH (ref 0–99)
NonHDL: 135.11
Total CHOL/HDL Ratio: 4
Triglycerides: 171 mg/dL — ABNORMAL HIGH (ref 0.0–149.0)
VLDL: 34.2 mg/dL (ref 0.0–40.0)

## 2022-09-20 LAB — CBC WITH DIFFERENTIAL/PLATELET
Basophils Absolute: 0 10*3/uL (ref 0.0–0.1)
Basophils Relative: 0.9 % (ref 0.0–3.0)
Eosinophils Absolute: 0.2 10*3/uL (ref 0.0–0.7)
Eosinophils Relative: 4.6 % (ref 0.0–5.0)
HCT: 45.1 % (ref 39.0–52.0)
Hemoglobin: 15.5 g/dL (ref 13.0–17.0)
Lymphocytes Relative: 30.6 % (ref 12.0–46.0)
Lymphs Abs: 1.4 10*3/uL (ref 0.7–4.0)
MCHC: 34.5 g/dL (ref 30.0–36.0)
MCV: 95.4 fl (ref 78.0–100.0)
Monocytes Absolute: 0.5 10*3/uL (ref 0.1–1.0)
Monocytes Relative: 10.4 % (ref 3.0–12.0)
Neutro Abs: 2.4 10*3/uL (ref 1.4–7.7)
Neutrophils Relative %: 53.5 % (ref 43.0–77.0)
Platelets: 207 10*3/uL (ref 150.0–400.0)
RBC: 4.72 Mil/uL (ref 4.22–5.81)
RDW: 12.4 % (ref 11.5–15.5)
WBC: 4.5 10*3/uL (ref 4.0–10.5)

## 2022-09-20 LAB — BASIC METABOLIC PANEL
BUN: 16 mg/dL (ref 6–23)
CO2: 29 mEq/L (ref 19–32)
Calcium: 9.5 mg/dL (ref 8.4–10.5)
Chloride: 103 mEq/L (ref 96–112)
Creatinine, Ser: 0.88 mg/dL (ref 0.40–1.50)
GFR: 94.51 mL/min (ref >60.00)
Glucose, Bld: 95 mg/dL (ref 70–99)
Potassium: 4.5 mEq/L (ref 3.5–5.1)
Sodium: 139 mEq/L (ref 135–145)

## 2022-09-20 LAB — PSA: PSA: 0.3 ng/mL (ref 0.10–4.00)

## 2022-09-20 LAB — HEMOGLOBIN A1C: Hgb A1c MFr Bld: 5.2 % (ref 4.6–6.5)

## 2022-09-20 LAB — TSH: TSH: 0.94 u[IU]/mL (ref 0.35–5.50)

## 2022-09-20 MED ORDER — ATORVASTATIN CALCIUM 20 MG PO TABS: 20.0000 mg | ORAL_TABLET | Freq: Every day | ORAL | 3 refills | Status: DC

## 2022-09-20 MED ORDER — AMLODIPINE BESYLATE 5 MG PO TABS: 5.0000 mg | ORAL_TABLET | Freq: Every day | ORAL | 3 refills | Status: DC

## 2022-09-20 NOTE — Progress Notes (Signed)
Subjective:    Patient ID: Todd Reid, male    DOB: May 26, 1961, 61 y.o.   MRN: [id number]  HPI Here for a well exam. He has a few issues to discuss. First he began to have ringing in the left ear about 6 weeks ago. No change in hearing. No ear pain or dizziness. Second he has had an intermittent mild sharp pain in the left chest area for 6 months. No SOB. This is not related to exertion. Third for several months he has noticed bulge in the middle of his abdomen when he does a sit up. This is not painful. BM's are normal. Last his hemorrhoids are bothering  him more than ever with pain and bleeding. He has tried several types of ointments and he has seen GI to band them in the past, but nothing has helped.    Review of Systems  Constitutional: Negative.   HENT:  Positive for tinnitus. Negative for ear pain and hearing loss.   Eyes: Negative.   Respiratory: Negative.    Cardiovascular:  Positive for chest pain.  Gastrointestinal: Negative.   Genitourinary: Negative.   Musculoskeletal: Negative.   Skin: Negative.   Neurological: Negative.   Psychiatric/Behavioral: Negative.         Objective:   Physical Exam Constitutional:      General: He is not in acute distress.    Appearance: Normal appearance. He is well-developed. He is not diaphoretic.  HENT:     Head: Normocephalic and atraumatic.     Right Ear: External ear normal.     Left Ear: External ear normal.     Nose: Nose normal.     Mouth/Throat:     Pharynx: No oropharyngeal exudate.  Eyes:     General: No scleral icterus.       Right eye: No discharge.        Left eye: No discharge.     Conjunctiva/sclera: Conjunctivae normal.     Pupils: Pupils are equal, round, and reactive to light.  Neck:     Thyroid: No thyromegaly.     Vascular: No JVD.     Trachea: No tracheal deviation.  Cardiovascular:     Rate and Rhythm: Normal rate and regular rhythm.     Heart sounds: Normal heart sounds. No murmur heard.     No friction rub. No gallop.  Pulmonary:     Effort: Pulmonary effort is normal. No respiratory distress.     Breath sounds: Normal breath sounds. No wheezing or rales.  Chest:     Chest wall: No tenderness.  Abdominal:     General: Bowel sounds are normal. There is no distension.     Palpations: Abdomen is soft. There is no mass.     Tenderness: There is no abdominal tenderness. There is no guarding or rebound.     Comments: He has a small diastasis recti in the midline   Genitourinary:    Penis: Normal. No tenderness.      Testes: Normal.     Prostate: Normal.     Rectum: Guaiac result negative.     Comments: There are multiple external hemorrhoids that are inflamed  Musculoskeletal:        General: No tenderness. Normal range of motion.     Cervical back: Neck supple.  Lymphadenopathy:     Cervical: No cervical adenopathy.  Skin:    General: Skin is warm and dry.     Coloration: Skin is not pale.  Findings: No erythema or rash.  Neurological:     Mental Status: He is alert and oriented to person, place, and time.     Cranial Nerves: No cranial nerve deficit.     Motor: No abnormal muscle tone.     Coordination: Coordination normal.     Deep Tendon Reflexes: Reflexes are normal and symmetric. Reflexes normal.  Psychiatric:        Behavior: Behavior normal.        Thought Content: Thought content normal.        Judgment: Judgment normal.           Assessment & Plan:  Well exam. We discussed diet and exercise. Get fasting labs. Refer to ENT for the tinnitus. He has some musculoskeletal pain along the left sternal  margin, and I reassured him this is benign. He has a diastasis recti, and the best thing he can do for this would be to lose weight. Refer to Surgery to evaluate the external hemorrhoids.  Alysia Penna, MD

## 2022-09-26 ENCOUNTER — Encounter: Payer: Self-pay | Admitting: *Deleted

## 2022-09-26 ENCOUNTER — Telehealth: Payer: Self-pay | Admitting: *Deleted

## 2022-09-26 NOTE — Patient Outreach (Signed)
  Care Coordination   Initial Visit Note   09/26/2022 Name: Todd Reid MRN: [id number] DOB: 1961/10/04  Todd Reid is a 61 y.o. year old male who sees Todd Morale, MD for primary care. I spoke with  Todd Reid by phone today.  What matters to the patients health and wellness today?  No needs    Goals Addressed               This Visit's Progress     COMPLETED: No needs (pt-stated)        Care Coordination Interventions: Reviewed medications with patient and discussed adherence with all medications and no needed refills Reviewed scheduled/upcoming provider appointments including pendin g appointments with sufficient transportation Screening for signs and symptoms of depression related to chronic disease state  Assessed social determinant of health barriers           SDOH assessments and interventions completed:  Yes  SDOH Interventions Today    Flowsheet Row Most Recent Value  SDOH Interventions   Food Insecurity Interventions Intervention Not Indicated  Housing Interventions Intervention Not Indicated  Transportation Interventions Intervention Not Indicated  Utilities Interventions Intervention Not Indicated        Care Coordination Interventions Activated:  Yes  Care Coordination Interventions:  Yes, provided   Follow up plan: No further intervention required.   Encounter Outcome:  Pt. Visit Completed   Raina Mina, RN Care Management Coordinator [place] Office [phone]

## 2022-09-26 NOTE — Patient Instructions (Signed)
Visit Information  Thank you for taking time to visit with me today. Please don't hesitate to contact me if I can be of assistance to you.   Following are the goals we discussed today:   Goals Addressed               This Visit's Progress     COMPLETED: No needs (pt-stated)        Care Coordination Interventions: Reviewed medications with patient and discussed adherence with all medications and no needed refills Reviewed scheduled/upcoming provider appointments including pendin g appointments with sufficient transportation Screening for signs and symptoms of depression related to chronic disease state  Assessed social determinant of health barriers           Please call the care guide team at [phone] if you need to cancel or reschedule your appointment.   If you are experiencing a Mental Health or [place] or need someone to talk to, please call the Suicide and Crisis Lifeline: 988  Patient verbalizes understanding of instructions and care plan provided today and agrees to view in [place]. Active MyChart status and patient understanding of how to access instructions and care plan via MyChart confirmed with patient.     No further follow up required: No needs  Raina Mina, RN Care Management Coordinator [place] [facility] [phone]

## 2022-10-03 ENCOUNTER — Encounter: Payer: Self-pay | Admitting: Family Medicine

## 2022-11-10 DIAGNOSIS — K644 Residual hemorrhoidal skin tags: Secondary | ICD-10-CM | POA: Diagnosis not present

## 2022-11-10 DIAGNOSIS — K643 Fourth degree hemorrhoids: Secondary | ICD-10-CM | POA: Diagnosis not present

## 2022-11-10 DIAGNOSIS — K635 Polyp of colon: Secondary | ICD-10-CM | POA: Diagnosis not present

## 2022-11-10 DIAGNOSIS — Z86010 Personal history of colonic polyps: Secondary | ICD-10-CM | POA: Diagnosis not present

## 2022-12-30 ENCOUNTER — Ambulatory Visit (INDEPENDENT_AMBULATORY_CARE_PROVIDER_SITE_OTHER): Payer: BC Managed Care – PPO | Admitting: Family Medicine

## 2022-12-30 ENCOUNTER — Encounter: Payer: Self-pay | Admitting: Family Medicine

## 2022-12-30 VITALS — BP 142/80 | HR 65 | Temp 98.5°F | Ht 69.75 in | Wt 208.0 lb

## 2022-12-30 DIAGNOSIS — R6889 Other general symptoms and signs: Secondary | ICD-10-CM | POA: Diagnosis not present

## 2022-12-30 DIAGNOSIS — J40 Bronchitis, not specified as acute or chronic: Secondary | ICD-10-CM

## 2022-12-30 LAB — POCT INFLUENZA A/B
Influenza A, POC: NEGATIVE
Influenza B, POC: NEGATIVE

## 2022-12-30 LAB — POC COVID19 BINAXNOW: SARS Coronavirus 2 Ag: NEGATIVE

## 2022-12-30 LAB — POCT RAPID STREP A (OFFICE): Rapid Strep A Screen: NEGATIVE

## 2022-12-30 MED ORDER — AZITHROMYCIN 250 MG PO TABS: ORAL_TABLET | ORAL | 0 refills | Status: DC

## 2022-12-30 NOTE — Progress Notes (Signed)
   Subjective:    Patient ID: Todd Reid, male    DOB: 08/03/1961, 62 y.o.   MRN: [id number]  HPI Here for 5 days of chest congestion and coughing up green sputum. No fever or SOB. Some PND and ST. Using Nyquil and Mucinex.    Review of Systems  Constitutional: Negative.   HENT:  Positive for congestion, postnasal drip and sore throat. Negative for ear pain and sinus pressure.   Eyes: Negative.   Respiratory:  Positive for cough. Negative for shortness of breath and wheezing.        Objective:   Physical Exam Constitutional:      Appearance: Normal appearance. He is well-developed. He is not ill-appearing.  HENT:     Right Ear: Tympanic membrane, ear canal and external ear normal.     Left Ear: Tympanic membrane, ear canal and external ear normal.     Nose: Nose normal.     Mouth/Throat:     Pharynx: Oropharynx is clear.  Eyes:     Conjunctiva/sclera: Conjunctivae normal.  Pulmonary:     Effort: Pulmonary effort is normal.     Breath sounds: Rhonchi present. No wheezing or rales.  Lymphadenopathy:     Cervical: No cervical adenopathy.  Neurological:     Mental Status: He is alert.           Assessment & Plan:  Bronchitis, treat with a Zpack.  Alysia Penna, MD

## 2023-02-07 DIAGNOSIS — H9042 Sensorineural hearing loss, unilateral, left ear, with unrestricted hearing on the contralateral side: Secondary | ICD-10-CM | POA: Diagnosis not present

## 2023-02-07 DIAGNOSIS — H9312 Tinnitus, left ear: Secondary | ICD-10-CM | POA: Diagnosis not present

## 2023-02-17 ENCOUNTER — Other Ambulatory Visit: Payer: Self-pay | Admitting: Surgery

## 2023-02-17 DIAGNOSIS — K644 Residual hemorrhoidal skin tags: Secondary | ICD-10-CM | POA: Diagnosis not present

## 2023-02-17 DIAGNOSIS — K643 Fourth degree hemorrhoids: Secondary | ICD-10-CM | POA: Diagnosis not present

## 2023-06-30 ENCOUNTER — Other Ambulatory Visit: Payer: Self-pay | Admitting: Family Medicine

## 2023-08-29 DIAGNOSIS — L820 Inflamed seborrheic keratosis: Secondary | ICD-10-CM | POA: Diagnosis not present

## 2023-08-29 DIAGNOSIS — L821 Other seborrheic keratosis: Secondary | ICD-10-CM | POA: Diagnosis not present

## 2023-08-29 DIAGNOSIS — D225 Melanocytic nevi of trunk: Secondary | ICD-10-CM | POA: Diagnosis not present

## 2023-08-29 DIAGNOSIS — Z85828 Personal history of other malignant neoplasm of skin: Secondary | ICD-10-CM | POA: Diagnosis not present

## 2023-10-19 ENCOUNTER — Encounter: Payer: Self-pay | Admitting: Family Medicine

## 2023-10-20 MED ORDER — AMLODIPINE BESYLATE 5 MG PO TABS: 5.0000 mg | ORAL_TABLET | Freq: Every day | ORAL | 3 refills | Status: AC

## 2023-10-20 MED ORDER — SILDENAFIL CITRATE 20 MG PO TABS: 20.0000 mg | ORAL_TABLET | Freq: Every day | ORAL | 3 refills | Status: AC | PRN

## 2023-11-14 DIAGNOSIS — Z85828 Personal history of other malignant neoplasm of skin: Secondary | ICD-10-CM | POA: Diagnosis not present

## 2023-11-14 DIAGNOSIS — L820 Inflamed seborrheic keratosis: Secondary | ICD-10-CM | POA: Diagnosis not present

## 2023-11-14 DIAGNOSIS — L570 Actinic keratosis: Secondary | ICD-10-CM | POA: Diagnosis not present

## 2024-02-28 DIAGNOSIS — Z23 Encounter for immunization: Secondary | ICD-10-CM | POA: Diagnosis not present

## 2024-04-10 DIAGNOSIS — Z23 Encounter for immunization: Secondary | ICD-10-CM | POA: Diagnosis not present

## 2024-04-26 ENCOUNTER — Encounter: Payer: Self-pay | Admitting: Family Medicine

## 2024-04-26 DIAGNOSIS — Z136 Encounter for screening for cardiovascular disorders: Secondary | ICD-10-CM

## 2024-04-30 DIAGNOSIS — Z713 Dietary counseling and surveillance: Secondary | ICD-10-CM | POA: Diagnosis not present

## 2024-04-30 NOTE — Telephone Encounter (Signed)
 I ordered the test

## 2024-05-09 DIAGNOSIS — E785 Hyperlipidemia, unspecified: Secondary | ICD-10-CM | POA: Diagnosis not present

## 2024-05-09 DIAGNOSIS — I10 Essential (primary) hypertension: Secondary | ICD-10-CM | POA: Diagnosis not present

## 2024-05-10 ENCOUNTER — Ambulatory Visit (INDEPENDENT_AMBULATORY_CARE_PROVIDER_SITE_OTHER)

## 2024-05-10 ENCOUNTER — Encounter: Payer: Self-pay | Admitting: Podiatry

## 2024-05-10 ENCOUNTER — Ambulatory Visit: Admitting: Podiatry

## 2024-05-10 DIAGNOSIS — M7751 Other enthesopathy of right foot: Secondary | ICD-10-CM

## 2024-05-10 DIAGNOSIS — M7752 Other enthesopathy of left foot: Secondary | ICD-10-CM | POA: Diagnosis not present

## 2024-05-10 DIAGNOSIS — M79675 Pain in left toe(s): Secondary | ICD-10-CM

## 2024-05-10 DIAGNOSIS — M79674 Pain in right toe(s): Secondary | ICD-10-CM | POA: Diagnosis not present

## 2024-05-10 MED ORDER — TRIAMCINOLONE ACETONIDE 10 MG/ML IJ SUSP
10.0000 mg | Freq: Once | INTRAMUSCULAR | Status: AC
Start: 2024-05-10 — End: 2024-05-10
  Administered 2024-05-10: 10 mg via INTRA_ARTICULAR

## 2024-05-11 NOTE — Progress Notes (Signed)
 Subjective:   Patient ID: Todd Reid, male   DOB: 63 y.o.   MRN: [id number]   HPI Patient states he has been developing pain in his big toe joints again and it has been almost 3 years since we saw him and overall it has been doing well   ROS      Objective:  Physical Exam  Neurovascular status intact with discomfort not have an intense nature but inflamed around the first MPJ right right foot over left foot with reduced range of motion of the joint     Assessment:  Chronic hallux limitus condition that we are evaluating that has been bothersome     Plan:  H&P x-rays taken reviewed sterile prep injected periarticular around the first MPJ bilateral 3 mg Kenalog  5 mg Xylocaine  advised on rigid bottom shoes and reappoint in the next 1 to 2 years for reevaluation.  Understands at 1 point in future may need surgery  X-rays indicate change in the first MPJ right over left with moderate spur formation but joints have not changed significantly when compared to 3 years ago

## 2024-05-14 ENCOUNTER — Other Ambulatory Visit: Payer: Self-pay

## 2024-05-14 ENCOUNTER — Encounter: Payer: Self-pay | Admitting: Family Medicine

## 2024-05-14 MED ORDER — ATORVASTATIN CALCIUM 20 MG PO TABS: 20.0000 mg | ORAL_TABLET | Freq: Every day | ORAL | 0 refills | Status: DC

## 2024-05-24 ENCOUNTER — Ambulatory Visit (HOSPITAL_BASED_OUTPATIENT_CLINIC_OR_DEPARTMENT_OTHER)
Admission: RE | Admit: 2024-05-24 | Discharge: 2024-05-24 | Disposition: A | Discharge status: Home / Self Care | Payer: Self-pay | Source: Ambulatory Visit | Attending: Family Medicine | Admitting: Family Medicine

## 2024-05-24 ENCOUNTER — Ambulatory Visit: Payer: Self-pay | Admitting: Family Medicine

## 2024-05-24 ENCOUNTER — Other Ambulatory Visit (HOSPITAL_BASED_OUTPATIENT_CLINIC_OR_DEPARTMENT_OTHER)

## 2024-05-24 DIAGNOSIS — Z136 Encounter for screening for cardiovascular disorders: Secondary | ICD-10-CM | POA: Insufficient documentation

## 2024-05-28 ENCOUNTER — Other Ambulatory Visit (HOSPITAL_BASED_OUTPATIENT_CLINIC_OR_DEPARTMENT_OTHER)

## 2024-07-25 ENCOUNTER — Other Ambulatory Visit: Payer: Self-pay | Admitting: Family Medicine

## 2024-08-28 DIAGNOSIS — L570 Actinic keratosis: Secondary | ICD-10-CM | POA: Diagnosis not present

## 2024-08-28 DIAGNOSIS — L814 Other melanin hyperpigmentation: Secondary | ICD-10-CM | POA: Diagnosis not present

## 2024-08-28 DIAGNOSIS — L820 Inflamed seborrheic keratosis: Secondary | ICD-10-CM | POA: Diagnosis not present

## 2024-08-28 DIAGNOSIS — D485 Neoplasm of uncertain behavior of skin: Secondary | ICD-10-CM | POA: Diagnosis not present

## 2024-08-28 DIAGNOSIS — L821 Other seborrheic keratosis: Secondary | ICD-10-CM | POA: Diagnosis not present

## 2024-08-28 DIAGNOSIS — Z85828 Personal history of other malignant neoplasm of skin: Secondary | ICD-10-CM | POA: Diagnosis not present

## 2024-09-16 ENCOUNTER — Encounter: Payer: Self-pay | Admitting: Podiatry

## 2024-10-18 ENCOUNTER — Ambulatory Visit

## 2024-10-18 DIAGNOSIS — M2021 Hallux rigidus, right foot: Secondary | ICD-10-CM | POA: Diagnosis not present

## 2024-10-18 DIAGNOSIS — M2022 Hallux rigidus, left foot: Secondary | ICD-10-CM | POA: Diagnosis not present

## 2024-10-18 NOTE — Progress Notes (Signed)
 Visit:  ORTHOTIC SCAN/ EVALUATION  Patient presented for evaluation/ scan for custom molded foot orthotics. This is a reorder from previous pair originally from 2019. Morton's extension added on to the right orthotic.   Patient will benefit from custom foot orthotics to provide total contact to bilateral medial longitudinal arches to help balance and distribute body weight more evenly.  Thus reducing plantar pressure and pain.   Orthotic will encourage forefoot and rearfoot alignment.    Patient was scanned today with OHI scanner.    Orthotics are ordered.  Signature obtained for notification of pricing/ fees for the device.  When the orthotic is ready for pick up, will call to make an appointment for a fitting.

## 2024-10-20 ENCOUNTER — Other Ambulatory Visit: Payer: Self-pay | Admitting: Family Medicine

## 2024-10-30 ENCOUNTER — Other Ambulatory Visit: Payer: Self-pay | Admitting: Family Medicine

## 2024-10-30 NOTE — Telephone Encounter (Signed)
 Copied from CRM #[id number]. Topic: Clinical - Medication Question >> Oct 30, 2024  1:07 PM Nessti S wrote: Reason for CRM: pt wanted to know if he able to get prescribe 5 day supply for amLODipine  (NORVASC ) 5 MG tablet, atorvastatin  (LIPITOR) 20 MG tablet and sildenafil  (REVATIO ) 20 MG tablet [place]. [facility]  [address] [place]

## 2024-10-31 ENCOUNTER — Telehealth: Payer: Self-pay

## 2024-10-31 NOTE — Telephone Encounter (Signed)
 Patient's Orthotics are in. Balance is $440.68

## 2024-11-11 NOTE — Telephone Encounter (Signed)
 Appt scheduled 11/22/24

## 2024-11-22 ENCOUNTER — Ambulatory Visit: Admitting: Podiatrist

## 2024-11-22 DIAGNOSIS — M7751 Other enthesopathy of right foot: Secondary | ICD-10-CM

## 2024-11-22 DIAGNOSIS — M7752 Other enthesopathy of left foot: Secondary | ICD-10-CM

## 2024-11-22 NOTE — Progress Notes (Signed)
 ORTHOTIC DISPENSING:   Reason for Visit:         Fitting and Delivery of Custom Fabricated Foot Orthoses Patient Report:            Patient reports comfort and is satisfied with device.   OBJECTIVE DATA: Patient History / Diagnosis:    No change in pathology Provided Device:                     Functional foot orthoses   GOAL OF ORTHOSIS - Improve gait - Decrease energy expenditure - Improve Balance - Provide Triplanar stability of foot complex - Facilitate motion   ACTIONS PERFORMED Patient was fit with custom foot orthoses   Patient was provided with verbal and written instruction and demonstration regarding wear, care, proper fit, function, and use of the orthosis.    Patient was also provided with verbal instruction regarding how to report any failures or malfunctions of the orthosis and necessary follow up care. Patient was also instructed to contact our office regarding any change in status that may affect the function of the orthosis.   Patient demonstrated understanding of all instructions.  Todd Reid, DPM

## 2024-12-03 ENCOUNTER — Encounter: Payer: Self-pay | Admitting: Podiatrist

## 2024-12-05 NOTE — Telephone Encounter (Signed)
 Patient advised to schedule appointment.

## 2024-12-06 ENCOUNTER — Telehealth: Payer: Self-pay | Admitting: Podiatrist

## 2024-12-06 NOTE — Telephone Encounter (Signed)
 Patient called in stating that he doesn't think the orthotics that he picked up are correct. He says that they don't look like the pair he's received in the past. I scheduled him an appointment and he says he will bring the pair he received to the appointment.

## 2024-12-13 ENCOUNTER — Ambulatory Visit (INDEPENDENT_AMBULATORY_CARE_PROVIDER_SITE_OTHER): Payer: Self-pay | Admitting: Podiatrist

## 2024-12-13 ENCOUNTER — Telehealth: Payer: Self-pay | Admitting: Physician Assistant

## 2024-12-13 DIAGNOSIS — M7752 Other enthesopathy of left foot: Secondary | ICD-10-CM

## 2024-12-13 DIAGNOSIS — M25532 Pain in left wrist: Secondary | ICD-10-CM

## 2024-12-13 DIAGNOSIS — M7751 Other enthesopathy of right foot: Secondary | ICD-10-CM

## 2024-12-13 NOTE — Progress Notes (Signed)
 " Virtual Visit Consent   Todd Reid, you are scheduled for a virtual visit with a  provider today. Just as with appointments in the office, your consent must be obtained to participate. Your consent will be active for this visit and any virtual visit you may have with one of our providers in the next 365 days. If you have a MyChart account, a copy of this consent can be sent to you electronically.  As this is a virtual visit, video technology does not allow for your provider to perform a traditional examination. This may limit your provider's ability to fully assess your condition. If your provider identifies any concerns that need to be evaluated in person or the need to arrange testing (such as labs, EKG, etc.), we will make arrangements to do so. Although advances in technology are sophisticated, we cannot ensure that it will always work on either your end or our end. If the connection with a video visit is poor, the visit may have to be switched to a telephone visit. With either a video or telephone visit, we are not always able to ensure that we have a secure connection.  By engaging in this virtual visit, you consent to the provision of healthcare and authorize for your insurance to be billed (if applicable) for the services provided during this visit. Depending on your insurance coverage, you may receive a charge related to this service.  I need to obtain your verbal consent now. Are you willing to proceed with your visit today? Todd Reid has provided verbal consent on 12/13/2024 for a virtual visit (video or telephone). Teena Shuck, [place]  Date: 12/13/2024 2:16 PM   Virtual Visit via Video Note   I, Teena Shuck, connected with  Todd Reid  ([id number], 1960/12/30) on 12/13/24 at  2:00 PM EST by a video-enabled telemedicine application and verified that I am speaking with the correct person using two identifiers.  Location: Patient: Virtual  Visit Location Patient: Home Provider: Virtual Visit Location Provider: Home Office   I discussed the limitations of evaluation and management by telemedicine and the availability of in person appointments. The patient expressed understanding and agreed to proceed.    History of Present Illness: Todd Reid is a 64 y.o. who identifies as a male who was assigned male at birth, and is being seen today for left wrist paim after fall in November.  HPI: Wrist Pain  The pain is present in the left wrist. This is a new problem. The current episode started more than 1 month ago. There has been a history of trauma. The problem occurs daily. The problem has been unchanged. Associated symptoms include an inability to bear weight. Pertinent negatives include no fever. The symptoms are aggravated by activity. He has tried NSAIDS, rest and acetaminophen  for the symptoms. The treatment provided mild relief.    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  Resting  comfortably  at home.  Head is normocephalic, atraumatic.  No labored breathing.  Speech is clear and coherent with logical content.  Patient is alert and oriented at baseline.    Assessment and Plan: 1. Left wrist pain (Primary)  Patient with left wrist pain on tenderness since November after fall without  evaluation. Given duration of symptoms and presentation advised patient that he would need to present in person for xray imaging at minimum. He agreed to this plan and verbalized understanding.   Follow Up Instructions: I discussed the assessment and treatment plan with the patient. The patient was provided an opportunity to ask questions and all were answered. The patient agreed with the plan and demonstrated an understanding of the instructions.  A copy of instructions were sent to the patient via MyChart unless otherwise noted below.    The patient was advised to call back or seek an in-person evaluation if the symptoms worsen or if the condition fails to improve as anticipated.    Stuti Sandin, PA-C    [1] No Known Allergies [2]  Current Outpatient Medications:    amLODipine  (NORVASC ) 5 MG tablet, Take 1 tablet (5 mg total) by mouth daily., Disp: 90 tablet, Rfl: 3   atorvastatin  (LIPITOR) 20 MG tablet, TAKE 1 TABLET DAILY (NEED TO SCHEDULE PHYSICAL EXAM), Disp: 90 tablet, Rfl: 3   sildenafil  (REVATIO ) 20 MG tablet, Take 1 tablet (20 mg total) by mouth daily as needed., Disp: 30 tablet, Rfl: 3  "

## 2024-12-13 NOTE — Progress Notes (Signed)
 Rutha presents for an orthotic adjustment.  He relates his old orthotics that have worked well for him in the past have a rigid extension to help his great toe joint not to bend.  I am sending back his orthotics and will order full length device in neoprene with a semirigid mortons extension bilateral.  We will call when the orthotics are back and ready for pick up

## 2024-12-13 NOTE — Patient Instructions (Signed)
" °  Todd Casandra Poncho, thank you for joining Teena Shuck, PA-C for today's virtual visit.  While this provider is not your primary care provider (PCP), if your PCP is located in our provider database this encounter information will be shared with them immediately following your visit.   A [facility] MyChart account gives you access to today's visit and all your visits, tests, and labs performed at [facility]  click here if you don't have a [facility] MyChart account or go to [email or web address]  Consent: (Patient) Todd Reid provided verbal consent for this virtual visit at the beginning of the encounter.  Current Medications:  Current Outpatient Medications:    amLODipine  (NORVASC ) 5 MG tablet, Take 1 tablet (5 mg total) by mouth daily., Disp: 90 tablet, Rfl: 3   atorvastatin  (LIPITOR) 20 MG tablet, TAKE 1 TABLET DAILY (NEED TO SCHEDULE PHYSICAL EXAM), Disp: 90 tablet, Rfl: 3   sildenafil  (REVATIO ) 20 MG tablet, Take 1 tablet (20 mg total) by mouth daily as needed., Disp: 30 tablet, Rfl: 3   Medications ordered in this encounter:  No orders of the defined types were placed in this encounter.    *If you need refills on other medications prior to your next appointment, please contact your pharmacy*  Follow-Up: Call back or seek an in-person evaluation if the symptoms worsen or if the condition fails to improve as anticipated.     Other Instructions Report in person for evaluation.    If you have been instructed to have an in-person evaluation today at a local Urgent Care facility, please use the link below. It will take you to a list of all of our available [facility], including address, phone number and hours of operation. Please do not delay care.  [facility]  If you or a family member do not have a primary care provider, use the link below to schedule a visit and establish care. When you choose a [facility]  primary care physician or advanced practice provider, you gain a long-term partner in health. Find a Primary Care Provider  Learn more about [facility]'s in-office and virtual care options: [facility] Get Care Now  "
